# Patient Record
Sex: Female | Born: 1985 | Race: Black or African American | Hispanic: No | Marital: Single | Smoking: Former smoker
Health system: Southern US, Community
[De-identification: ages and names within clinical notes are randomized; demographics above are authoritative.]

## PROBLEM LIST (undated history)

## (undated) DIAGNOSIS — I10 Essential (primary) hypertension: Secondary | ICD-10-CM

## (undated) DIAGNOSIS — F419 Anxiety disorder, unspecified: Secondary | ICD-10-CM

## (undated) DIAGNOSIS — R519 Headache, unspecified: Secondary | ICD-10-CM

## (undated) DIAGNOSIS — R51 Headache: Secondary | ICD-10-CM

---

## 2010-04-29 ENCOUNTER — Emergency Department (HOSPITAL_COMMUNITY): Admission: EM | Admit: 2010-04-29 | Discharge: 2010-04-29 | Payer: Self-pay | Admitting: Emergency Medicine

## 2010-09-03 LAB — POCT RAPID STREP A (OFFICE): Streptococcus, Group A Screen (Direct): POSITIVE — AB

## 2012-04-06 ENCOUNTER — Encounter: Payer: 59 | Admitting: Obstetrics and Gynecology

## 2012-12-08 ENCOUNTER — Emergency Department (HOSPITAL_COMMUNITY)
Admission: EM | Admit: 2012-12-08 | Discharge: 2012-12-08 | Disposition: A | Discharge status: Home / Self Care | Payer: BC Managed Care – PPO | Attending: Emergency Medicine | Admitting: Emergency Medicine

## 2012-12-08 ENCOUNTER — Encounter (HOSPITAL_COMMUNITY): Payer: Self-pay | Admitting: Emergency Medicine

## 2012-12-08 DIAGNOSIS — M549 Dorsalgia, unspecified: Secondary | ICD-10-CM

## 2012-12-08 DIAGNOSIS — IMO0001 Reserved for inherently not codable concepts without codable children: Secondary | ICD-10-CM | POA: Insufficient documentation

## 2012-12-08 DIAGNOSIS — M545 Low back pain, unspecified: Secondary | ICD-10-CM | POA: Insufficient documentation

## 2012-12-08 DIAGNOSIS — Z87828 Personal history of other (healed) physical injury and trauma: Secondary | ICD-10-CM | POA: Insufficient documentation

## 2012-12-08 DIAGNOSIS — M546 Pain in thoracic spine: Secondary | ICD-10-CM | POA: Insufficient documentation

## 2012-12-08 DIAGNOSIS — R52 Pain, unspecified: Secondary | ICD-10-CM | POA: Insufficient documentation

## 2012-12-08 MED ORDER — DIAZEPAM 5 MG PO TABS
5.0000 mg | ORAL_TABLET | Freq: Once | ORAL | Status: AC
  Administered 2012-12-08: 5 mg via ORAL
  Filled 2012-12-08: qty 1

## 2012-12-08 MED ORDER — OXYCODONE-ACETAMINOPHEN 5-325 MG PO TABS
2.0000 | ORAL_TABLET | Freq: Once | ORAL | Status: DC
  Filled 2012-12-08: qty 2

## 2012-12-08 MED ORDER — DIAZEPAM 5 MG PO TABS: 5.0000 mg | ORAL_TABLET | Freq: Two times a day (BID) | ORAL | Status: DC | PRN

## 2012-12-08 MED ORDER — TRAMADOL HCL 50 MG PO TABS
50.0000 mg | ORAL_TABLET | Freq: Once | ORAL | Status: AC
  Administered 2012-12-08: 50 mg via ORAL
  Filled 2012-12-08: qty 1

## 2012-12-08 MED ORDER — TRAMADOL HCL 50 MG PO TABS: 50.0000 mg | ORAL_TABLET | Freq: Four times a day (QID) | ORAL | Status: DC | PRN

## 2012-12-08 NOTE — ED Notes (Signed)
Pt reports she has spasming from neck radiating down to lower back; pain she has felt before but this time it is worse it has ever been. Reports lying on stomach and icing it helped last time.

## 2012-12-08 NOTE — ED Notes (Signed)
PA at bedside.

## 2012-12-08 NOTE — ED Notes (Signed)
Ice pack provided. Pt is tearful; states she has never had pain this badly.

## 2012-12-08 NOTE — ED Notes (Signed)
PT ambulated with baseline gait; VSS; A&Ox3; no signs of distress; respirations even and unlabored; skin warm and dry; no questions upon discharge.  

## 2012-12-08 NOTE — ED Notes (Signed)
Patient is resting comfortably and reports feeling better.
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
## 2012-12-09 NOTE — ED Provider Notes (Signed)
Medical screening examination/treatment/procedure(s) were performed by non-physician practitioner and as supervising physician I was immediately available for consultation/collaboration.   Terin Dierolf III, MD 12/09/12 1005 

## 2012-12-10 ENCOUNTER — Ambulatory Visit (INDEPENDENT_AMBULATORY_CARE_PROVIDER_SITE_OTHER): Payer: 59 | Admitting: Family Medicine

## 2012-12-10 ENCOUNTER — Encounter: Payer: Self-pay | Admitting: Family Medicine

## 2012-12-10 ENCOUNTER — Ambulatory Visit: Payer: 59 | Admitting: Family Medicine

## 2012-12-10 VITALS — BP 120/84 | Temp 98.6°F | Ht 64.5 in | Wt 194.0 lb

## 2012-12-10 DIAGNOSIS — K047 Periapical abscess without sinus: Secondary | ICD-10-CM

## 2012-12-10 MED ORDER — AMOXICILLIN 875 MG PO TABS: 875.0000 mg | ORAL_TABLET | Freq: Two times a day (BID) | ORAL | Status: DC

## 2012-12-10 NOTE — Progress Notes (Signed)
  Subjective:    Patient ID: Breanna Pope, female    DOB: 02/27/1986, 27 y.o.   MRN: [id number]  HPI She has a two-day history of right upper tooth pain and swelling.   Review of Systems     Objective:   Physical Exam Alert and in no distress. Right cheek is slightly swollen. She does have some tenderness palpation over the first molar on the right upper teeth. Exam of the area does show some slight erythema with pain on motion of the tooth.. No adenopathy noted.       Assessment & Plan:  Tooth abscess - Plan: amoxicillin (AMOXIL) 875 MG tablet recommend ibuprofen for pain relief and used tramadol which she or he has. Also recommend she followup with a dentist.

## 2012-12-10 NOTE — Patient Instructions (Signed)
Take all the antibiotic and use 4 Advil 3 times per day and if you need extra pain relief use her tramadol. Him to see the dentist

## 2013-05-16 ENCOUNTER — Encounter (HOSPITAL_COMMUNITY): Payer: Self-pay | Admitting: Family Medicine

## 2013-05-16 ENCOUNTER — Emergency Department (INDEPENDENT_AMBULATORY_CARE_PROVIDER_SITE_OTHER)
Admission: EM | Admit: 2013-05-16 | Discharge: 2013-05-16 | Disposition: A | Discharge status: Home / Self Care | Payer: 59 | Source: Home / Self Care

## 2013-05-16 DIAGNOSIS — K047 Periapical abscess without sinus: Secondary | ICD-10-CM | POA: Diagnosis present

## 2013-05-16 DIAGNOSIS — J029 Acute pharyngitis, unspecified: Secondary | ICD-10-CM

## 2013-05-16 LAB — POCT INFECTIOUS MONO SCREEN: Mono Screen: NEGATIVE

## 2013-05-16 LAB — POCT RAPID STREP A: Streptococcus, Group A Screen (Direct): NEGATIVE

## 2013-05-16 MED ORDER — AMOXICILLIN 875 MG PO TABS: 875.0000 mg | ORAL_TABLET | Freq: Two times a day (BID) | ORAL | Status: DC

## 2013-05-16 NOTE — ED Provider Notes (Signed)
Medical screening examination/treatment/procedure(s) were performed by a resident physician or non-physician practitioner and as the supervising physician I was immediately available for consultation/collaboration.  Evan Corey, MD      Evan S Corey, MD 05/16/13 2112 

## 2013-05-16 NOTE — ED Provider Notes (Signed)
CSN: [id number]     Arrival date & time 05/16/13  1558 History   None    Chief Complaint  Patient presents with  . Sore Throat  . Headache   (Consider location/radiation/quality/duration/timing/severity/associated sxs/prior Treatment) HPI  Sore throat: started last night. Associated w/ cough, HA, and body aches, adn fatigue. Denies fevers. Decreased PO. Denies h/o mono. Getting worse. Now feeling nauseaus. Nephew w/ cough (lives w/ pt.)  History reviewed. No pertinent past medical history. History reviewed. No pertinent past surgical history. No family history on file. History  Substance Use Topics  . Smoking status: Former Games developer  . Smokeless tobacco: Never Used  . Alcohol Use: Not on file   OB History   Grav Para Term Preterm Abortions TAB SAB Ect Mult Living                 Review of Systems  Constitutional: Positive for chills, activity change, appetite change and fatigue. Negative for fever.  Respiratory: Negative for shortness of breath.   Cardiovascular: Negative for chest pain.  All other systems reviewed and are negative.    Allergies  Review of patient's allergies indicates no known allergies.  Home Medications   Current Outpatient Rx  Name  Route  Sig  Dispense  Refill  . Pseudoeph-Doxylamine-DM-APAP (NYQUIL PO)   Oral   Take by mouth.         Marland Kitchen acetaminophen (TYLENOL) 500 MG tablet   Oral   Take 1,500 mg by mouth every 6 (six) hours as needed for pain.         Marland Kitchen amoxicillin (AMOXIL) 875 MG tablet   Oral   Take 1 tablet (875 mg total) by mouth 2 (two) times daily.   20 tablet   0   . diazepam (VALIUM) 5 MG tablet   Oral   Take 1 tablet (5 mg total) by mouth every 12 (twelve) hours as needed for anxiety.   10 tablet   0   . Multiple Vitamin (MULTIVITAMIN WITH MINERALS) TABS   Oral   Take 1 tablet by mouth daily.         . traMADol (ULTRAM) 50 MG tablet   Oral   Take 1 tablet (50 mg total) by mouth every 6 (six) hours as needed for  pain.   15 tablet   0    BP 141/87  Pulse 86  Temp(Src) 99.1 F (37.3 C) (Oral)  Resp 16  SpO2 97%  LMP 04/30/2013 Physical Exam  Constitutional: She is oriented to person, place, and time. She appears well-developed and well-nourished. No distress.  HENT:  EOMI, Mild maxillary sinus tenderness on palpation, tonsils nml, no exudate and mild pharyngeal injection. No cervical lymphadenopathy. Mild boggy nasal turbinates.   Cardiovascular: Normal rate, normal heart sounds and intact distal pulses.   Pulmonary/Chest: Breath sounds normal. She is in respiratory distress.  Abdominal: Soft.  Musculoskeletal: Normal range of motion.  Neurological: She is alert and oriented to person, place, and time.  Skin: Skin is warm and dry.  Psychiatric: She has a normal mood and affect. Her behavior is normal. Judgment and thought content normal.    ED Course  Procedures (including critical care time) Labs Review Labs Reviewed  MONONUCLEOSIS SCREEN  POCT INFECTIOUS MONO SCREEN  POCT RAPID STREP A ([facility] ONLY)   Imaging Review No results found.  EKG Interpretation    Date/Time:    Ventricular Rate:    PR Interval:    QRS Duration:  QT Interval:    QTC Calculation:   R Axis:     Text Interpretation:              MDM   1. Sore throat   Sore throat. Strep likely etiology despite neg rapid strep due to acute onset, PE,  adn severity of symptoms w/ lack of viral URI symptomatology. Mono Neg. - Amox if symptoms worsen - Ibuprofen 600mg  - precuations given and all questions answered  Shelly Flatten, MD Family Medicine PGY-3 05/16/2013, 5:44 PM      Ozella Rocks, MD 05/16/13 1744  Ozella Rocks, MD 05/16/13 [phone]

## 2013-05-16 NOTE — ED Notes (Signed)
C/o sore throat, body aches, fatigue, body pains, headache and laryngitis.  Onset Saturday night of symptoms.

## 2013-05-18 LAB — CULTURE, GROUP A STREP

## 2013-05-24 ENCOUNTER — Telehealth: Payer: Self-pay | Admitting: Family Medicine

## 2013-05-24 NOTE — Telephone Encounter (Signed)
ER LETTER SENT 

## 2013-06-29 ENCOUNTER — Encounter (HOSPITAL_COMMUNITY): Payer: Self-pay | Admitting: Emergency Medicine

## 2013-06-29 ENCOUNTER — Emergency Department (INDEPENDENT_AMBULATORY_CARE_PROVIDER_SITE_OTHER)
Admission: EM | Admit: 2013-06-29 | Discharge: 2013-06-29 | Disposition: A | Discharge status: Home / Self Care | Payer: 59 | Source: Home / Self Care | Attending: Emergency Medicine | Admitting: Emergency Medicine

## 2013-06-29 DIAGNOSIS — A084 Viral intestinal infection, unspecified: Secondary | ICD-10-CM

## 2013-06-29 DIAGNOSIS — A088 Other specified intestinal infections: Secondary | ICD-10-CM

## 2013-06-29 LAB — POCT I-STAT, CHEM 8
BUN: 8 mg/dL (ref 6–23)
Calcium, Ion: 1.29 mmol/L — ABNORMAL HIGH (ref 1.12–1.23)
Chloride: 107 mEq/L (ref 96–112)
Creatinine, Ser: 0.9 mg/dL (ref 0.50–1.10)
GLUCOSE: 93 mg/dL (ref 70–99)
HEMATOCRIT: 39 % (ref 36.0–46.0)
HEMOGLOBIN: 13.3 g/dL (ref 12.0–15.0)
POTASSIUM: 3.9 meq/L (ref 3.7–5.3)
SODIUM: 143 meq/L (ref 137–147)
TCO2: 25 mmol/L (ref 0–100)

## 2013-06-29 MED ORDER — ONDANSETRON 4 MG PO TBDP
8.0000 mg | ORAL_TABLET | Freq: Once | ORAL | Status: AC
  Administered 2013-06-29: 8 mg via ORAL

## 2013-06-29 MED ORDER — ONDANSETRON 8 MG PO TBDP: 8.0000 mg | ORAL_TABLET | Freq: Three times a day (TID) | ORAL | Status: DC | PRN

## 2013-06-29 MED ORDER — ONDANSETRON 4 MG PO TBDP
ORAL_TABLET | ORAL | Status: AC
  Filled 2013-06-29: qty 2

## 2013-06-29 MED ORDER — DIPHENOXYLATE-ATROPINE 2.5-0.025 MG PO TABS: 1.0000 | ORAL_TABLET | Freq: Four times a day (QID) | ORAL | Status: DC | PRN

## 2013-06-29 MED ORDER — SODIUM CHLORIDE 0.9 % IV SOLN
INTRAVENOUS | Status: DC
  Administered 2013-06-29: 13:00:00 via INTRAVENOUS

## 2013-06-29 NOTE — ED Provider Notes (Signed)
Chief Complaint   Chief Complaint  Patient presents with  . URI     History of Present Illness   Breanna SellsConstance Pope is a 28 year old female who has had a history since last night of nausea and vomiting with greenish emesis. She vomited 3 times. She had 2 diarrheal stools. Today she feels fatigued and faint. She's also had some rhinorrhea and cough. She denies any fever or chills. No blood in the vomitus or the stools. She denies any recent travel, suspicious exposures, ingestions, or animal exposure.  Review of Systems   Other than as noted above, the patient denies any of the following symptoms: Systemic:  No fevers, chills, sweats, weight loss or gain, fatigue, or tiredness. ENT:  No nasal congestion, rhinorrhea, or sore throat. Lungs:  No cough, wheezing, or shortness of breath. Cardiac:  No chest pain, syncope, or presyncope. GI:  No abdominal pain, nausea, vomiting, anorexia, diarrhea, constipation, blood in stool or vomitus. GU:  No dysuria, frequency, or urgency.  PMFSH   Past medical history, family history, social history, meds, and allergies were reviewed.   Physical Exam     Vital signs:  BP 125/78  Pulse 88  Temp(Src) 97.1 F (36.2 C) (Oral)  Resp 18  Ht 5' 3.5" (1.613 m)  Wt 195 lb (88.451 kg)  BMI 34.00 kg/m2  SpO2 100%  LMP 06/17/2013 Filed Vitals:   06/29/13 1201 06/29/13 1237 Supine  06/29/13 1238 Sitting  06/29/13 1239 Standing   BP: 124/54 123/67 128/88 125/78  Pulse: 88 72  88  Temp: 97.1 F (36.2 C)     TempSrc: Oral     Resp: 18     Height: 5' 3.5" (1.613 m)     Weight: 195 lb (88.451 kg)     SpO2: 100%      General:  Alert and oriented.  In no distress.  Skin warm and dry.  Good skin turgor, brisk capillary refill. ENT:  No scleral icterus, moist mucous membranes, no oral lesions, pharynx clear. Lungs:  Breath sounds clear and equal bilaterally.  No wheezes, rales, or rhonchi. Heart:  Rhythm regular, without extrasystoles.  No gallops or  murmers. Abdomen:  Soft, flat, nondistended. No organomegaly or mass. Bowel sounds are hyperactive. No tenderness, guarding, or rebound. Skin: Clear, warm, and dry.  Good turgor.  Brisk capillary refill.  Labs   Results for orders placed during the hospital encounter of 06/29/13  POCT I-STAT, CHEM 8      Result Value Range   Sodium 143  137 - 147 mEq/L   Potassium 3.9  3.7 - 5.3 mEq/L   Chloride 107  96 - 112 mEq/L   BUN 8  6 - 23 mg/dL   Creatinine, Ser 9.140.90  0.50 - 1.10 mg/dL   Glucose, Bld 93  70 - 99 mg/dL   Calcium, Ion 7.821.29 (*) 1.12 - 1.23 mmol/L   TCO2 25  0 - 100 mmol/L   Hemoglobin 13.3  12.0 - 15.0 g/dL   HCT 95.639.0  21.336.0 - 08.646.0 %     Course in Urgent Care Center   She was given Zofran ODT 8 mg sublingually and 1 L of normal saline IV. Thereafter the patient states she felt a lot better.   Assessment   The encounter diagnosis was Viral gastroenteritis.  Plan   1.  Meds:  The following meds were prescribed:   Discharge Medication List as of 06/29/2013  2:31 PM    START taking these medications  Details  diphenoxylate-atropine (LOMOTIL) 2.5-0.025 MG per tablet Take 1 tablet by mouth 4 (four) times daily as needed for diarrhea or loose stools., Starting 06/29/2013, Until Discontinued, Print    ondansetron (ZOFRAN ODT) 8 MG disintegrating tablet Take 1 tablet (8 mg total) by mouth every 8 (eight) hours as needed for nausea., Starting 06/29/2013, Until Discontinued, Normal        2.  Patient Education/Counseling:  The patient was given appropriate handouts, self care instructions, and instructed in symptomatic relief. The patient was told to stay on clear liquids for the remainder of the day, then advance to a B.R.A.T. diet starting tomorrow.  3.  Follow up:  The patient was told to follow up here if no better in 2 to 3 days, or sooner if becoming worse in any way, and given some red flag symptoms such as persistent vomitng, high fever, severe abdominal pain, or any GI  bleeding which would prompt immediate return.        Reuben Likes, MD 06/29/13 2231

## 2013-06-29 NOTE — ED Notes (Signed)
Pt reports she feels much better upon d/c

## 2013-06-29 NOTE — ED Notes (Signed)
Pt c/o cold sxs onset last night w/sxs that include: vomiting, diarrhea, light headed, cough, near syncope, runny nose Reports she has had 2 loose stools today and 4 episodes of vomiting Denies: fevers, SOB, wheezing... States her nephew and sister are sick Alert w/no signs of acute distress.

## 2013-06-29 NOTE — Discharge Instructions (Signed)

## 2014-02-13 ENCOUNTER — Encounter (HOSPITAL_COMMUNITY): Payer: Self-pay | Admitting: Emergency Medicine

## 2014-02-13 ENCOUNTER — Emergency Department (HOSPITAL_COMMUNITY)
Admission: EM | Admit: 2014-02-13 | Discharge: 2014-02-14 | Discharge status: Against Medical Advice | Payer: 59 | Attending: Emergency Medicine | Admitting: Emergency Medicine

## 2014-02-13 DIAGNOSIS — K089 Disorder of teeth and supporting structures, unspecified: Secondary | ICD-10-CM | POA: Insufficient documentation

## 2014-02-13 NOTE — ED Notes (Signed)
Pt states she has an abscessed tooth on the top left and is here to get some antibiotics

## 2014-05-22 ENCOUNTER — Encounter (HOSPITAL_COMMUNITY): Payer: Self-pay

## 2014-05-22 ENCOUNTER — Emergency Department (INDEPENDENT_AMBULATORY_CARE_PROVIDER_SITE_OTHER)
Admission: EM | Admit: 2014-05-22 | Discharge: 2014-05-22 | Disposition: A | Discharge status: Home / Self Care | Payer: 59 | Source: Home / Self Care | Attending: Emergency Medicine | Admitting: Emergency Medicine

## 2014-05-22 DIAGNOSIS — J00 Acute nasopharyngitis [common cold]: Secondary | ICD-10-CM

## 2014-05-22 NOTE — ED Notes (Signed)
Body aches, fever, cough, chills, HA, throat itchy. Brother here and is having similar symptoms

## 2014-05-22 NOTE — ED Provider Notes (Signed)
CSN: [id number]     Arrival date & time 05/22/14  1017 History   First MD Initiated Contact with Patient 05/22/14 1040     Chief Complaint  Patient presents with  . URI   (Consider location/radiation/quality/duration/timing/severity/associated sxs/prior Treatment) Patient is a 28 y.o. female presenting with URI. The history is provided by the patient.  URI Presenting symptoms: congestion, cough, fatigue, rhinorrhea and sore throat   Severity:  Mild Onset quality:  Gradual Duration:  24 hours Timing:  Constant Progression:  Unchanged Chronicity:  New Associated symptoms: headaches and myalgias   Associated symptoms: no neck pain and no wheezing     History reviewed. No pertinent past medical history. History reviewed. No pertinent past surgical history. Family History  Problem Relation Age of Onset  . Hypertension Mother   . Hypertension Other   . Diabetes Other    History  Substance Use Topics  . Smoking status: Former Smoker    Types: Cigarettes    Quit date: 11/21/2013  . Smokeless tobacco: Never Used  . Alcohol Use: No   OB History    No data available     Review of Systems  Constitutional: Positive for fatigue.  HENT: Positive for congestion, rhinorrhea and sore throat.   Respiratory: Positive for cough. Negative for wheezing.   Musculoskeletal: Positive for myalgias. Negative for neck pain.  Neurological: Positive for headaches.  All other systems reviewed and are negative.   Allergies  Review of patient's allergies indicates no known allergies.  Home Medications   Prior to Admission medications   Medication Sig Start Date End Date Taking? Authorizing Provider  ibuprofen (ADVIL,MOTRIN) 200 MG tablet Take 400 mg by mouth every 6 (six) hours as needed (for pain.).    Historical Provider, MD   BP 121/72 mmHg  Pulse 68  Temp(Src) 98.1 F (36.7 C) (Oral)  Resp 16  SpO2 98% Physical Exam  Constitutional: She is oriented to person, place, and time. She  appears well-developed and well-nourished. No distress.  HENT:  Head: Normocephalic and atraumatic.  Right Ear: Hearing, tympanic membrane, external ear and ear canal normal.  Left Ear: Hearing, tympanic membrane, external ear and ear canal normal.  Nose: Nose normal.  Mouth/Throat: Uvula is midline, oropharynx is clear and moist and mucous membranes are normal.  Eyes: Conjunctivae are normal. No scleral icterus.  Neck: Normal range of motion. Neck supple.  Cardiovascular: Normal rate, regular rhythm and normal heart sounds.   Pulmonary/Chest: Effort normal and breath sounds normal.  Musculoskeletal: Normal range of motion.  Lymphadenopathy:    She has no cervical adenopathy.  Neurological: She is alert and oriented to person, place, and time.  Skin: Skin is warm and dry. No rash noted. No erythema.  Psychiatric: She has a normal mood and affect. Her behavior is normal.  Nursing note and vitals reviewed.   ED Course  Procedures (including critical care time) Labs Review Labs Reviewed - No data to display  Imaging Review No results found.   MDM   1. Common cold    Exam benign Symptomatic care at home    Ria ClockJennifer Lee H Panhia Karl, [place]PA 05/22/14 1129

## 2014-05-22 NOTE — Discharge Instructions (Signed)
Fluids, rest, tylenol as directed on packaging for aches and fever Delsym as directed on packaging for cough Salt water gargles for throat Upper Respiratory Infection, Adult An upper respiratory infection (URI) is also sometimes known as the common cold. The upper respiratory tract includes the nose, sinuses, throat, trachea, and bronchi. Bronchi are the airways leading to the lungs. Most people improve within 1 week, but symptoms can last up to 2 weeks. A residual cough may last even longer.  CAUSES Many different viruses can infect the tissues lining the upper respiratory tract. The tissues become irritated and inflamed and often become very moist. Mucus production is also common. A cold is contagious. You can easily spread the virus to others by oral contact. This includes kissing, sharing a glass, coughing, or sneezing. Touching your mouth or nose and then touching a surface, which is then touched by another person, can also spread the virus. SYMPTOMS  Symptoms typically develop 1 to 3 days after you come in contact with a cold virus. Symptoms vary from person to person. They may include:  Runny nose.  Sneezing.  Nasal congestion.  Sinus irritation.  Sore throat.  Loss of voice (laryngitis).  Cough.  Fatigue.  Muscle aches.  Loss of appetite.  Headache.  Low-grade fever. DIAGNOSIS  You might diagnose your own cold based on familiar symptoms, since most people get a cold 2 to 3 times a year. Your caregiver can confirm this based on your exam. Most importantly, your caregiver can check that your symptoms are not due to another disease such as strep throat, sinusitis, pneumonia, asthma, or epiglottitis. Blood tests, throat tests, and X-rays are not necessary to diagnose a common cold, but they may sometimes be helpful in excluding other more serious diseases. Your caregiver will decide if any further tests are required. RISKS AND COMPLICATIONS  You may be at risk for a more  severe case of the common cold if you smoke cigarettes, have chronic heart disease (such as heart failure) or lung disease (such as asthma), or if you have a weakened immune system. The very young and very old are also at risk for more serious infections. Bacterial sinusitis, middle ear infections, and bacterial pneumonia can complicate the common cold. The common cold can worsen asthma and chronic obstructive pulmonary disease (COPD). Sometimes, these complications can require emergency medical care and may be life-threatening. PREVENTION  The best way to protect against getting a cold is to practice good hygiene. Avoid oral or hand contact with people with cold symptoms. Wash your hands often if contact occurs. There is no clear evidence that vitamin C, vitamin E, echinacea, or exercise reduces the chance of developing a cold. However, it is always recommended to get plenty of rest and practice good nutrition. TREATMENT  Treatment is directed at relieving symptoms. There is no cure. Antibiotics are not effective, because the infection is caused by a virus, not by bacteria. Treatment may include:  Increased fluid intake. Sports drinks offer valuable electrolytes, sugars, and fluids.  Breathing heated mist or steam (vaporizer or shower).  Eating chicken soup or other clear broths, and maintaining good nutrition.  Getting plenty of rest.  Using gargles or lozenges for comfort.  Controlling fevers with ibuprofen or acetaminophen as directed by your caregiver.  Increasing usage of your inhaler if you have asthma. Zinc gel and zinc lozenges, taken in the first 24 hours of the common cold, can shorten the duration and lessen the severity of symptoms. Pain medicines may  help with fever, muscle aches, and throat pain. A variety of non-prescription medicines are available to treat congestion and runny nose. Your caregiver can make recommendations and may suggest nasal or lung inhalers for other symptoms.    HOME CARE INSTRUCTIONS   Only take over-the-counter or prescription medicines for pain, discomfort, or fever as directed by your caregiver.  Use a warm mist humidifier or inhale steam from a shower to increase air moisture. This may keep secretions moist and make it easier to breathe.  Drink enough water and fluids to keep your urine clear or pale yellow.  Rest as needed.  Return to work when your temperature has returned to normal or as your caregiver advises. You may need to stay home longer to avoid infecting others. You can also use a face mask and careful hand washing to prevent spread of the virus. SEEK MEDICAL CARE IF:   After the first few days, you feel you are getting worse rather than better.  You need your caregiver's advice about medicines to control symptoms.  You develop chills, worsening shortness of breath, or brown or red sputum. These may be signs of pneumonia.  You develop yellow or brown nasal discharge or pain in the face, especially when you bend forward. These may be signs of sinusitis.  You develop a fever, swollen neck glands, pain with swallowing, or white areas in the back of your throat. These may be signs of strep throat. SEEK IMMEDIATE MEDICAL CARE IF:   You have a fever.  You develop severe or persistent headache, ear pain, sinus pain, or chest pain.  You develop wheezing, a prolonged cough, cough up blood, or have a change in your usual mucus (if you have chronic lung disease).  You develop sore muscles or a stiff neck. Document Released: 12/03/2000 Document Revised: 09/01/2011 Document Reviewed: 09/14/2013 [facility] Patient Information 2015 [place], [place]. This information is not intended to replace advice given to you by your health care provider. Make sure you discuss any questions you have with your health care provider.

## 2014-06-04 ENCOUNTER — Encounter (HOSPITAL_COMMUNITY): Payer: Self-pay

## 2014-06-04 ENCOUNTER — Emergency Department (HOSPITAL_COMMUNITY)
Admission: EM | Admit: 2014-06-04 | Discharge: 2014-06-04 | Disposition: A | Discharge status: Home / Self Care | Payer: 59 | Attending: Emergency Medicine | Admitting: Emergency Medicine

## 2014-06-04 DIAGNOSIS — Z720 Tobacco use: Secondary | ICD-10-CM | POA: Insufficient documentation

## 2014-06-04 DIAGNOSIS — K047 Periapical abscess without sinus: Secondary | ICD-10-CM | POA: Diagnosis not present

## 2014-06-04 DIAGNOSIS — K088 Other specified disorders of teeth and supporting structures: Secondary | ICD-10-CM | POA: Diagnosis present

## 2014-06-04 DIAGNOSIS — K029 Dental caries, unspecified: Secondary | ICD-10-CM | POA: Diagnosis not present

## 2014-06-04 MED ORDER — NAPROXEN 500 MG PO TABS
500.0000 mg | ORAL_TABLET | Freq: Once | ORAL | Status: AC
  Administered 2014-06-04: 500 mg via ORAL
  Filled 2014-06-04: qty 1

## 2014-06-04 MED ORDER — HYDROCODONE-ACETAMINOPHEN 5-325 MG PO TABS: 1.0000 | ORAL_TABLET | Freq: Four times a day (QID) | ORAL | Status: DC | PRN

## 2014-06-04 MED ORDER — PENICILLIN V POTASSIUM 500 MG PO TABS: 500.0000 mg | ORAL_TABLET | Freq: Four times a day (QID) | ORAL | Status: AC

## 2014-06-04 MED ORDER — PENICILLIN V POTASSIUM 500 MG PO TABS
500.0000 mg | ORAL_TABLET | Freq: Once | ORAL | Status: AC
  Administered 2014-06-04: 500 mg via ORAL
  Filled 2014-06-04: qty 1

## 2014-06-04 MED ORDER — NAPROXEN 500 MG PO TABS: 500.0000 mg | ORAL_TABLET | Freq: Two times a day (BID) | ORAL | Status: DC

## 2014-06-04 NOTE — Discharge Instructions (Signed)
Take penicillin as prescribed. Take naproxen as prescribed for pain control. You may take Norco in addition to naproxen for severe pain. Do not drive or drink alcohol after taking this medication. Recommend follow-up with a dentist for further evaluation of symptoms. If your abscess persists, it will likely need draining by a dentist. Follow-up with the emergency department if symptoms worsen.  Dental Abscess A dental abscess is a collection of infected fluid (pus) from a bacterial infection in the inner part of the tooth (pulp). It usually occurs at the end of the tooth's root.  CAUSES   Severe tooth decay.  Trauma to the tooth that allows bacteria to enter into the pulp, such as a broken or chipped tooth. SYMPTOMS   Severe pain in and around the infected tooth.  Swelling and redness around the abscessed tooth or in the mouth or face.  Tenderness.  Pus drainage.  Bad breath.  Bitter taste in the mouth.  Difficulty swallowing.  Difficulty opening the mouth.  Nausea.  Vomiting.  Chills.  Swollen neck glands. DIAGNOSIS   A medical and dental history will be taken.  An examination will be performed by tapping on the abscessed tooth.  X-rays may be taken of the tooth to identify the abscess. TREATMENT The goal of treatment is to eliminate the infection. You may be prescribed antibiotic medicine to stop the infection from spreading. A root canal may be performed to save the tooth. If the tooth cannot be saved, it may be pulled (extracted) and the abscess may be drained.  HOME CARE INSTRUCTIONS  Only take over-the-counter or prescription medicines for pain, fever, or discomfort as directed by your caregiver.  Rinse your mouth (gargle) often with salt water ( tsp salt in 8 oz [250 ml] of warm water) to relieve pain or swelling.  Do not drive after taking pain medicine (narcotics).  Do not apply heat to the outside of your face.  Return to your dentist for further  treatment as directed. SEEK MEDICAL CARE IF:  Your pain is not helped by medicine.  Your pain is getting worse instead of better. SEEK IMMEDIATE MEDICAL CARE IF:  You have a fever or persistent symptoms for more than 2-3 days.  You have a fever and your symptoms suddenly get worse.  You have chills or a very bad headache.  You have problems breathing or swallowing.  You have trouble opening your mouth.  You have swelling in the neck or around the eye. Document Released: 06/09/2005 Document Revised: 03/03/2012 Document Reviewed: 09/17/2010 [facility] Patient Information 2015 [place], [place]. This information is not intended to replace advice given to you by your health care provider. Make sure you discuss any questions you have with your health care provider.  Emergency Department Resource Guide 1) Find a Doctor and Pay Out of Pocket Although you won't have to find out who is covered by your insurance plan, it is a good idea to ask around and get recommendations. You will then need to call the office and see if the doctor you have chosen will accept you as a new patient and what types of options they offer for patients who are self-pay. Some doctors offer discounts or will set up payment plans for their patients who do not have insurance, but you will need to ask so you aren't surprised when you get to your appointment.  2) Contact Your Local Health Department Not all health departments have doctors that can see patients for sick visits, but many  do, so it is worth a call to see if yours does. If you don't know where your local health department is, you can check in your phone book. The CDC also has a tool to help you locate your state's health department, and many state websites also have listings of all of their local health departments.  3) Find a Walk-in Clinic If your illness is not likely to be very severe or complicated, you may want to try a walk in clinic. These are popping up all  over the country in pharmacies, drugstores, and shopping centers. They're usually staffed by nurse practitioners or physician assistants that have been trained to treat common illnesses and complaints. They're usually fairly quick and inexpensive. However, if you have serious medical issues or chronic medical problems, these are probably not your best option.  No Primary Care Doctor: - Call Health Connect at  [phone] - they can help you locate a primary care doctor that  accepts your insurance, provides certain services, etc. - Physician Referral Service- [phone]  Chronic Pain Problems: Organization         Address  Phone   Notes  [facility]  [phone] Patients need to be referred by their primary care doctor.   Medication Assistance: Organization         Address  Phone   Notes  [facility] Medication [facility] [address] [place] [place], [place] [address] [phone] --Must be a resident of [facility] -- Must have NO insurance coverage whatsoever (no Medicaid/ Medicare, etc.) -- The pt. MUST have a primary care doctor that directs their care regularly and follows them in the community   [facility]ist  [phone]   [employer]  [phone]    Agencies that provide inexpensive medical care: Organization         Address  Phone   Notes  [facility]  [phone]   [facility]    [phone]   [facility] [address] [place], [place] [address] [phone]   [facility] [address] [place] [place] [phone]   Planned Parenthood    [phone]   [facility]    [phone]   [facility]  [address], [place] Phone:  [phone], Fax:  [phone] Hours of Operation:  9 am - 6 pm, M-F.  Also accepts Medicaid/Medicare and self-pay.  [facility]  [address], [place] Phone: [phone], Fax: [phone]. Hours of Operation:  8:30 am - 5:30 pm, M-F.  Also accepts Medicaid and self-pay.  [facility] [place] [address], [place] Phone: [phone]   [facility] [address] Natasha Bence [place], [place] [phone], Ext. [phone] Mondays & Thursdays: 7-9 AM.  First 15 patients are seen on a first come, first serve basis.    Medicaid-accepting [facility] Providers:  Organization         Address  Phone   Notes  [facility] [address], [place] [phone] Also accepts self-pay patients.  [facility] [address] Laurell Josephs [place], [place]  [phone]   [facility] [address], [place] [phone]   [facility] [address], [place] [phone]   Renaye Rakers  [address], [place]   [phone] Only accepts [place] patients after they have their name applied to their card.   Self-Pay (no insurance) in [facility]:  Organization         Address  Phone   Notes  Sickle Cell Patients, [facility] [address] [place], [place] [phone]   [facility] [address] [place], [place] [phone]   [facility]  [address], [place] [phone]   [facility]/Dr. Osei-Bonsu  [address], [place] or [address], [place] [phone] Phone number for both [place] and [place] locations is the same.  [facility] [address], [place] [phone]   [facility] [address] [place] or [address] [phone]   [facility] [address] [place] [phone], phone; [phone], fax Sees patients 1st and 3rd Saturday of every  month.  Must not qualify for public or private insurance (i.e. Medicaid, Medicare, [place] Health Choice, Veterans' Benefits)  Household income should be no more than 200% of the poverty level The clinic cannot treat you if you are pregnant or think you are pregnant  Sexually transmitted diseases are not treated at the clinic.    Dental Care: Organization         Address  Phone  Notes  [facility] Department of [facility] [address] [place], [place] [phone] Accepts children up to age 69 who are enrolled in [place] or [place] Health Choice; pregnant women with a Medicaid card; and children who have applied for Medicaid or [place] Health Choice, but were declined, whose parents can pay a reduced fee at time of service.  [facility] Department of [facility]  [address], [place] [phone] Accepts children up to age 26 who are enrolled in [place] or [place] Health Choice; pregnant women with a Medicaid card; and children who have applied for Medicaid or  Health Choice, but were declined, whose parents can pay a reduced fee at time of service.  [facility]  [address] [place], [place] [phone] Patients are seen by appointment only. Walk-ins are not accepted. [facility] will see patients 90 years of age and older. Monday - Tuesday (8am-5pm) Most Wednesdays (8:30-5pm) $30 per visit, cash only  [facility]  [address], [facility] [phone] Patients are seen by appointment only. Walk-ins are not accepted. [facility] will see patients 57 years of age and older. One Wednesday Evening (Monthly: Volunteer Based).  $30 per visit, cash only  [employer]  [phone] for adults; Children under age 54, call Graduate Pediatric Dentistry at [phone]. Children aged 22-14, please call [phone] to request a pediatric application.  Dental  services are provided in all areas of dental care including fillings, crowns and bridges, complete and partial dentures, implants, gum treatment, root canals, and extractions. Preventive care is also provided. Treatment is provided to both adults and children. Patients are selected via a lottery and there is often a waiting list.   [facility] [address], [place]  [phone] [email or web address]   [facility] [address] [place], [place] [phone], Ext. [phone] Second  and Fourth Thursday of each month, opens at 6:30 AM; Clinic ends at 9 AM.  Patients are seen on a first-come first-served basis, and a limited number are seen during each clinic.   [facility]  [address], [place], [place] [phone]) [phone]   Eligibility Requirements You must have lived in [place], [place], or [place] counties for at least the last three months.   You cannot be eligible for state or federal sponsored [place]healthcare insurance, including CIG[employer]eterans Administration, [place]icaid, or [employer]icare.   You generally cannot be eligible for healthcare insurance through your employer.    How to apply: Eligibility screenings are held every Tuesday and Wednesday afternoon from 1:00 pm until 4:00 pm. You do not need an appointment for the interview!  [facility] [address], [place], [place] [phone]   [facility]  [phone]   [facility]  [phone]   [facility]  [phone]    Behavioral Health Resources in the Community: Intensive Outpatient Programs Organization         Address  Phone  Notes  [facility] [address], [place], [place] [phone]   [facility] [address], [place], [place] [phone]   [facility] [address], [place], [place]  [phone]   [facility] [address],    [place], [place] [phone] or [phone]   Substance Abuse Resources Organization         Address  Phone  Notes  [facility]  [phone]   [facility]  [phone]   [facility]  [phone]   [facility]  [phone]   Residential & Outpatient Substance Abuse Program  [phone]   Psychological Services Organization         Address  Phone  Notes  [facility]  [phone]- [phone]   [facility]  [phone]- [phone]   [facility] [address], [place] [phone] or [phone]    Mobile Crisis Teams Organization         Address  Phone  Notes  [facility], Mobile Crisis Care Unit  [phone]   [facility]  [address] [place], [place] [phone]   Doristine LocksSharon DeEsch [address] [place] [phone]    Self-Help/Support Groups Organization         Address  Phone             Notes  [facility] [place] - variety of support groups  336- [phone] Call for more information  Narcotics Anonymous (NA), [facility] [address], [place]  2 meetings at this location   Statisticianesidential Treatment Programs Organization         Address  Phone  Notes  [facility] [address],    [place]  [phone]   [facility]  [address], [place] [phone], [place], [place] [phone]   [facility] [address], [place] [phone] Admissions: 8am-3pm M-F  [facility] [address]    [place], [place] [phone]   [facility] [address], [place], [place] [phone]   [facility] [address]  [place], [place] [phone]   [facility] - Intensive Outpatient [address], [place], [place] [phone]   [facility] ([facility] [phone]  [address]  [place], [place]  [phone] or [phone]   [facility] ([facility]) [address] [place], [place] [phone] Accepts Medicaid  [facility] [address]  [place] [phone] Substance Abuse/Addiction Treatment   [facility] Resources Organization         Address  Phone  Notes  [facility]  [phone]) [phone]   Angie FavaJulie Brannon, PhD [address] [place], [place]   [phone] or [phone]   [facility]   [address] [place], [place] [phone]) [phone]   [facility] [address], [place], [place] [phone] Insurance/Medicaid/sponsorship through [facility]  [facility] Families [address]                                    [place], [place] [phone] Therapy/tele-psych/case  [facility] [address]   [place], [place] [phone]) [phone]    Dr. Lolly MustacheArfeen  [phone]   [facility]  [employer] [facility] 1) [address], [place] 2) [address], [place] 3)  [address], [place] [phone]  [phone]   [facility] Child Abuse Hotline [phone](336) [phone] or [phone]) [phone] (After Hours)

## 2014-06-04 NOTE — ED Notes (Signed)
Pt presents with c/o dental pain on the bottom left of her mouth that started yesterday. Pt reports that today she started noticing some swelling to that same area.

## 2014-06-04 NOTE — ED Provider Notes (Signed)
CSN: [id number]     Arrival date & time 06/04/14  2049 History   First MD Initiated Contact with Patient 06/04/14 2106     This chart was scribed for non-physician practitioner, Antony MaduraKelly Chon Buhl, PA-C working with Warnell Foresterrey Wofford, MD by Arlan OrganAshley Leger, ED Scribe. This patient was seen in room WTR5/WTR5 and the patient's care was started at 9:17 PM.   Chief Complaint  Patient presents with  . Dental Pain   The history is provided by the patient. No language interpreter was used.    HPI Comments: Breanna SellsConstance Pope is a 28 y.o. female who presents to the Emergency Department complaining of dental pain x 2 day. She also reports  Facial swelling onset this morning. Pain is exacerbated with chewing. No alleviating factors at this time. She has tried OTC Ibuprofen and Goody Powders with mild improvement. Last dose Ibuprofen this morning at approximately 10 AM. No inability swallow or open/close the mouth. No documented fevers at home, per patient. No recent drooling. She is not currently followed by a dentist but plans to establish this coming week. No known allergies to medications.  History reviewed. No pertinent past medical history. History reviewed. No pertinent past surgical history. Family History  Problem Relation Age of Onset  . Hypertension Mother   . Hypertension Other   . Diabetes Other    History  Substance Use Topics  . Smoking status: Current Every Day Smoker    Types: Cigarettes    Last Attempt to Quit: 11/21/2013  . Smokeless tobacco: Never Used  . Alcohol Use: No   OB History    No data available      Review of Systems  Constitutional: Negative for fever and chills.  HENT: Positive for dental problem and facial swelling. Negative for trouble swallowing.   All other systems reviewed and are negative.   Allergies  Review of patient's allergies indicates no known allergies.  Home Medications   Prior to Admission medications   Medication Sig Start Date End Date Taking?  Authorizing Provider  Aspirin-Acetaminophen (GOODY BODY PAIN) 500-325 MG PACK Take 1 Package by mouth every 8 (eight) hours as needed (pain).   Yes Historical Provider, MD  ibuprofen (ADVIL,MOTRIN) 200 MG tablet Take 400 mg by mouth every 6 (six) hours as needed (for pain.).   Yes Historical Provider, MD  HYDROcodone-acetaminophen (NORCO/VICODIN) 5-325 MG per tablet Take 1-2 tablets by mouth every 6 (six) hours as needed for moderate pain or severe pain. 06/04/14   Antony MaduraKelly Lalana Wachter, PA-C  naproxen (NAPROSYN) 500 MG tablet Take 1 tablet (500 mg total) by mouth 2 (two) times daily. 06/04/14   Antony MaduraKelly Colin Ellers, PA-C  penicillin v potassium (VEETID) 500 MG tablet Take 1 tablet (500 mg total) by mouth 4 (four) times daily. 06/04/14 06/11/14  Antony MaduraKelly Tomisha Reppucci, PA-C   Triage Vitals: BP 148/85 mmHg  Pulse 101  Temp(Src) 100.4 F (38 C) (Oral)  Resp 18  SpO2 100%  LMP 05/15/2014 (Approximate)   Physical Exam  Constitutional: She is oriented to person, place, and time. She appears well-developed and well-nourished. No distress.  Nontoxic/nonseptic appearing  HENT:  Head: Normocephalic and atraumatic.  Mouth/Throat: Uvula is midline, oropharynx is clear and moist and mucous membranes are normal. No oral lesions. No trismus in the jaw. Abnormal dentition. Dental caries present. No uvula swelling.    Mild L sided facial swelling with TTP to L lower 1st molar. Mild gingival erythema. No gingival fluctuance or discharge. No trismus. Uvula midline. Patient tolerating secretions without  difficulty  Eyes: Conjunctivae and EOM are normal. No scleral icterus.  Neck: Normal range of motion.  No nuchal rigidity or meningismus  Pulmonary/Chest: Effort normal. No respiratory distress.  Respirations even and unlabored  Abdominal: She exhibits no distension.  Musculoskeletal: Normal range of motion.  Neurological: She is alert and oriented to person, place, and time. She exhibits normal muscle tone. Coordination normal.  GCS  15. Speech is goal oriented. Patient moves extremities without ataxia  Skin: Skin is warm and dry. No rash noted. She is not diaphoretic. No erythema. No pallor.  Psychiatric: She has a normal mood and affect. Her behavior is normal.  Nursing note and vitals reviewed.   ED Course  Procedures (including critical care time)  DIAGNOSTIC STUDIES: Oxygen Saturation is 100% on RA, Normal by my interpretation.    COORDINATION OF CARE: 9:16 PM- Will give Naprosyn and Veetid. Discussed treatment plan with pt at bedside and pt agreed to plan.     Labs Review Labs Reviewed - No data to display  Imaging Review No results found.   EKG Interpretation None      MDM   Final diagnoses:  Dental abscess    Nontoxic/nonseptic appearing female presents to the ED for toothache. Physical exam findings suggestive of early abscess, not amenable to I&D. Exam unconcerning for Ludwig's angina or spread of infection. Will treat with penicillin and pain medicine. Urged patient to follow-up with dentist. Referral and resource guide provided. Patient agreeable to plan with no unaddressed concerns.  I personally performed the services described in this documentation, which was scribed in my presence. The recorded information has been reviewed and is accurate.    Antony MaduraKelly Irina Okelly, PA-C 06/04/14 2152  Warnell Foresterrey Wofford, MD 06/07/14 [phone]

## 2014-06-19 ENCOUNTER — Telehealth: Payer: Self-pay | Admitting: Family Medicine

## 2014-06-19 NOTE — Telephone Encounter (Signed)
ER letter sent 

## 2014-07-24 ENCOUNTER — Encounter (HOSPITAL_COMMUNITY): Payer: Self-pay

## 2014-07-24 ENCOUNTER — Emergency Department (INDEPENDENT_AMBULATORY_CARE_PROVIDER_SITE_OTHER)
Admission: EM | Admit: 2014-07-24 | Discharge: 2014-07-24 | Disposition: A | Discharge status: Home / Self Care | Payer: Self-pay | Source: Home / Self Care | Attending: Family Medicine | Admitting: Family Medicine

## 2014-07-24 DIAGNOSIS — K047 Periapical abscess without sinus: Secondary | ICD-10-CM

## 2014-07-24 MED ORDER — CLINDAMYCIN HCL 300 MG PO CAPS: 300.0000 mg | ORAL_CAPSULE | Freq: Three times a day (TID) | ORAL | Status: DC

## 2014-07-24 NOTE — Discharge Instructions (Signed)

## 2014-07-24 NOTE — ED Provider Notes (Signed)
CSN: [id number]     Arrival date & time 07/24/14  1414 History   First MD Initiated Contact with Patient 07/24/14 1604     Chief Complaint  Patient presents with  . Oral Swelling   (Consider location/radiation/quality/duration/timing/severity/associated sxs/prior Treatment) HPI        29 year old female with history of recurrent dental abscesses presents complaining of a dental abscess that ruptured washing was in the lobby today. The pain is significantly improved since then. She has felt tasting drainage into her mouth. This is in the left upper jaw. She denies systemic symptoms. She has some swelling of her cheek lateral to this. No sublingual swelling  History reviewed. No pertinent past medical history. History reviewed. No pertinent past surgical history. Family History  Problem Relation Age of Onset  . Hypertension Mother   . Hypertension Other   . Diabetes Other    History  Substance Use Topics  . Smoking status: Current Every Day Smoker    Types: Cigarettes    Last Attempt to Quit: 11/21/2013  . Smokeless tobacco: Never Used  . Alcohol Use: No   OB History    No data available     Review of Systems  Constitutional: Negative for fever.  HENT: Positive for dental problem and facial swelling.   All other systems reviewed and are negative.   Allergies  Review of patient's allergies indicates no known allergies.  Home Medications   Prior to Admission medications   Medication Sig Start Date End Date Taking? Authorizing Provider  Aspirin-Acetaminophen (GOODY BODY PAIN) 500-325 MG PACK Take 1 Package by mouth every 8 (eight) hours as needed (pain).    Historical Provider, MD  clindamycin (CLEOCIN) 300 MG capsule Take 1 capsule (300 mg total) by mouth 3 (three) times daily. 07/24/14   Graylon GoodZachary H Shaleah Nissley, PA-C  HYDROcodone-acetaminophen (NORCO/VICODIN) 5-325 MG per tablet Take 1-2 tablets by mouth every 6 (six) hours as needed for moderate pain or severe pain. 06/04/14   Antony MaduraKelly  Humes, PA-C  ibuprofen (ADVIL,MOTRIN) 200 MG tablet Take 400 mg by mouth every 6 (six) hours as needed (for pain.).    Historical Provider, MD  naproxen (NAPROSYN) 500 MG tablet Take 1 tablet (500 mg total) by mouth 2 (two) times daily. 06/04/14   Antony MaduraKelly Humes, PA-C   BP 121/81 mmHg  Pulse 90  Temp(Src) 99.6 F (37.6 C) (Oral)  SpO2 100% Physical Exam  Constitutional: She is oriented to person, place, and time. Vital signs are normal. She appears well-developed and well-nourished. No distress.  HENT:  Head: Normocephalic and atraumatic.  Mouth/Throat: Abnormal dentition. Dental abscesses (left upper first canine) and dental caries present.  Pulmonary/Chest: Effort normal. No respiratory distress.  Neurological: She is alert and oriented to person, place, and time. She has normal strength. Coordination normal.  Skin: Skin is warm and dry. No rash noted. She is not diaphoretic.  Psychiatric: She has a normal mood and affect. Judgment normal.  Nursing note and vitals reviewed.   ED Course  Procedures (including critical care time) Labs Review Labs Reviewed - No data to display  Imaging Review No results found.   MDM   1. Dental abscess    Dental abscess, drained spontaneously. She has significant swelling around this, treat with convalescent. Follow-up with dentist ASAP    Graylon GoodZachary H Ocean Kearley, PA-C 07/24/14 1622

## 2014-07-24 NOTE — ED Notes (Signed)
Describes abscess in mouth w subsequent rupture of same

## 2014-08-03 ENCOUNTER — Emergency Department (HOSPITAL_COMMUNITY)
Admission: EM | Admit: 2014-08-03 | Discharge: 2014-08-03 | Disposition: A | Discharge status: Home / Self Care | Payer: 59 | Attending: Emergency Medicine | Admitting: Emergency Medicine

## 2014-08-03 ENCOUNTER — Encounter (HOSPITAL_COMMUNITY): Payer: Self-pay | Admitting: Emergency Medicine

## 2014-08-03 ENCOUNTER — Emergency Department (HOSPITAL_COMMUNITY): Payer: 59

## 2014-08-03 ENCOUNTER — Emergency Department (INDEPENDENT_AMBULATORY_CARE_PROVIDER_SITE_OTHER)
Admission: EM | Admit: 2014-08-03 | Discharge: 2014-08-03 | Disposition: A | Discharge status: Other Acute Inpatient Hospital | Payer: 59 | Source: Home / Self Care | Attending: Family Medicine | Admitting: Family Medicine

## 2014-08-03 ENCOUNTER — Encounter (HOSPITAL_COMMUNITY): Payer: Self-pay | Admitting: *Deleted

## 2014-08-03 DIAGNOSIS — Z792 Long term (current) use of antibiotics: Secondary | ICD-10-CM | POA: Insufficient documentation

## 2014-08-03 DIAGNOSIS — I499 Cardiac arrhythmia, unspecified: Secondary | ICD-10-CM | POA: Diagnosis not present

## 2014-08-03 DIAGNOSIS — K219 Gastro-esophageal reflux disease without esophagitis: Secondary | ICD-10-CM | POA: Diagnosis not present

## 2014-08-03 DIAGNOSIS — Z79899 Other long term (current) drug therapy: Secondary | ICD-10-CM | POA: Diagnosis not present

## 2014-08-03 DIAGNOSIS — Z720 Tobacco use: Secondary | ICD-10-CM | POA: Insufficient documentation

## 2014-08-03 DIAGNOSIS — K047 Periapical abscess without sinus: Secondary | ICD-10-CM | POA: Diagnosis not present

## 2014-08-03 DIAGNOSIS — Z791 Long term (current) use of non-steroidal anti-inflammatories (NSAID): Secondary | ICD-10-CM | POA: Insufficient documentation

## 2014-08-03 DIAGNOSIS — R079 Chest pain, unspecified: Secondary | ICD-10-CM | POA: Diagnosis present

## 2014-08-03 DIAGNOSIS — IMO0001 Reserved for inherently not codable concepts without codable children: Secondary | ICD-10-CM

## 2014-08-03 LAB — CBC
HEMATOCRIT: 36.3 % (ref 36.0–46.0)
Hemoglobin: 11.8 g/dL — ABNORMAL LOW (ref 12.0–15.0)
MCH: 25.3 pg — ABNORMAL LOW (ref 26.0–34.0)
MCHC: 32.5 g/dL (ref 30.0–36.0)
MCV: 77.7 fL — AB (ref 78.0–100.0)
Platelets: 283 10*3/uL (ref 150–400)
RBC: 4.67 MIL/uL (ref 3.87–5.11)
RDW: 14.6 % (ref 11.5–15.5)
WBC: 8.1 10*3/uL (ref 4.0–10.5)

## 2014-08-03 LAB — BASIC METABOLIC PANEL
Anion gap: 5 (ref 5–15)
BUN: 5 mg/dL — AB (ref 6–23)
CHLORIDE: 104 mmol/L (ref 96–112)
CO2: 28 mmol/L (ref 19–32)
CREATININE: 0.72 mg/dL (ref 0.50–1.10)
Calcium: 9 mg/dL (ref 8.4–10.5)
GFR calc non Af Amer: >90 mL/min (ref >90)
GLUCOSE: 114 mg/dL — AB (ref 70–99)
Potassium: 3.2 mmol/L — ABNORMAL LOW (ref 3.5–5.1)
Sodium: 137 mmol/L (ref 135–145)

## 2014-08-03 LAB — I-STAT TROPONIN, ED: Troponin i, poc: 0.01 ng/mL (ref 0.00–0.08)

## 2014-08-03 MED ORDER — GI COCKTAIL ~~LOC~~
30.0000 mL | Freq: Once | ORAL | Status: AC
  Administered 2014-08-03: 30 mL via ORAL
  Filled 2014-08-03: qty 30

## 2014-08-03 MED ORDER — PANTOPRAZOLE SODIUM 40 MG PO TBEC
40.0000 mg | DELAYED_RELEASE_TABLET | Freq: Once | ORAL | Status: AC
  Administered 2014-08-03: 40 mg via ORAL
  Filled 2014-08-03: qty 1

## 2014-08-03 NOTE — ED Notes (Signed)
Dr. White at bedside.

## 2014-08-03 NOTE — ED Notes (Signed)
Pt states she was taking clindamycin for an abscess tooth that caused diarrhea. Pt is still taking the antibiotic.

## 2014-08-03 NOTE — Discharge Instructions (Signed)
The pain you are having is most likely from the acid in your stomach/esophagus. Take a strong antacid like Omeprazole (Prilosec), Lansoprazole (Prevacid), Esomeprazole (Nexium) twice a day for the next week, then decrease to once a day. Take the pill about 20-30 minutes before you eat.  Take mylanta with each meal as well. If your pain does not improve, add ibuprofen in the next few days (  every 6 hours as needed), in the meantime continue to try tylenol  every 4 hours or  every 6 hours. Follow the diet recommendations below as well. There is information below for establishing with a primary doctor.   To help with your diarrhea, take a probiotic or eat yogurt.  Food Choices for Gastroesophageal Reflux Disease When you have gastroesophageal reflux disease (GERD), the foods you eat and your eating habits are very important. Choosing the right foods can help ease the discomfort of GERD. WHAT GENERAL GUIDELINES DO I NEED TO FOLLOW?  Choose fruits, vegetables, whole grains, low-fat dairy products, and low-fat meat, fish, and poultry.  Limit fats such as oils, salad dressings, butter, nuts, and avocado.  Keep a food diary to identify foods that cause symptoms.  Avoid foods that cause reflux. These may be different for different people.  Eat frequent small meals instead of three large meals each day.  Eat your meals slowly, in a relaxed setting.  Limit fried foods.  Cook foods using methods other than frying.  Avoid drinking alcohol.  Avoid drinking large amounts of liquids with your meals.  Avoid bending over or lying down until 2-3 hours after eating. WHAT FOODS ARE NOT RECOMMENDED? The following are some foods and drinks that may worsen your symptoms: Vegetables Tomatoes. Tomato juice. Tomato and spaghetti sauce. Chili peppers. Onion and garlic. Horseradish. Fruits Oranges, grapefruit, and lemon (fruit and juice). Meats High-fat meats, fish, and poultry. This includes  hot dogs, ribs, ham, sausage, salami, and bacon. Dairy Whole milk and chocolate milk. Sour cream. Cream. Butter. Ice cream. Cream cheese.  Beverages Coffee and tea, with or without caffeine. Carbonated beverages or energy drinks. Condiments Hot sauce. Barbecue sauce.  Sweets/Desserts Chocolate and cocoa. Donuts. Peppermint and spearmint. Fats and Oils High-fat foods, including [place] fries and potato chips. Other Vinegar. Strong spices, such as black pepper, white pepper, red pepper, cayenne, curry powder, cloves, ginger, and chili powder. The items listed above may not be a complete list of foods and beverages to avoid. Contact your dietitian for more information. Document Released: 06/09/2005 Document Revised: 06/14/2013 Document Reviewed: 04/13/2013 [facility] Patient Information 2015 [place], [place]. This information is not intended to replace advice given to you by your health care provider. Make sure you discuss any questions you have with your health care provider.  No Primary Care Doctor:  Call Health Connect at [phone] - they can help you locate a primary care doctor that accepts your insurance, provides certain services, etc.  Physician Referral [phone] Medication Assistance:  Organization Address Phone Notes  [facility] Medication Assistance Program  [address]  [place], [place] [address]  [phone]  --Must be a resident of [facility]  -- Must have NO insurance coverage whatsoever (no Medicaid/ Medicare, etc.)  -- The pt. MUST have a primary care doctor that directs their care regularly and follows them in the community   [facility]   [phone]    [employer]   [phone]    Agencies that provide inexpensive medical care:  Hydrologist  Notes  [facility]   [phone]    [facility]   [phone]    [facility]  [address]  [place],  [place] [address]  [phone]    [facility]  [address],  [place]  [phone]    [facility]   [phone]    [facility]   [phone]    [facility]  [address], [place]  Phone: [phone], Fax: [phone]  Hours of Operation: 9 am - 6 pm, M-F. Also accepts Medicaid/Medicare and self-pay.   [facility]  [address], [place]  Phone: [phone]) [phone], Fax: [phone].  Hours of Operation: 8:30 am - 5:30 pm, M-F. Also accepts Medicaid and self-pay.   [facility] [place]  [address], [place]  Phone: [phone](336) [phone]    [facility]  [address], [place], [place]  [phone], Ext. [phone]  Mondays & Thursdays: 7-9 AM. First 15 patients are seen on a first come, first serve basis.   Medicaid-accepting [facility] Providers:  Organization Address Phone Notes  [facility]  [address], [place]  [phone]  Also accepts self-pay patients.   [facility]  [address], [place]  [phone]    [facility]  [address], [place]  [phone]    [facility]  [address] [place] [address], [place]  [phone]    [facility]land  [address], [place]  [phone]  Only accepts [place] patients after they have their name applied to their card.   Self-Pay (no insurance) in [facility]:  Organization Address Phone Notes  Sickle Cell Patients, [facility]  [address], [place]  [phone]    [facility]  [address], [place]  [phone]    [facility] [place]  [address], [place]  [phone]    [facility]/Dr. Osei-Bonsu  [address] [place] [address], [place] or [id number], [place]  [phone]  Phone number for both [place] and [place] locations is the same.   [facility]  [address], [place]  [phone]    [facility]  [address] [place] [address], [place] or [address]  [phone]  [phone]    [facility]  [address], [place]  [phone], phone; [phone]) [phone], fax   Sees patients 1st and 3rd Saturday of every month. Must not qualify for public or private insurance (i.e. Medicaid, Medicare, [place] Health Choice, Veterans' Benefits)   Household income should be no more than 200% of the poverty level The clinic cannot treat you if you are pregnant or think you are pregnant  Sexually transmitted diseases are not treated at the clinic.

## 2014-08-03 NOTE — ED Provider Notes (Signed)
CSN: [id number]     Arrival date & time 08/03/14  1636 History   First MD Initiated Contact with Patient 08/03/14 1815     Chief Complaint  Patient presents with  . Chest Pain     (Consider location/radiation/quality/duration/timing/severity/associated sxs/prior Treatment) HPI Comments: Pt is a 29 y.o. female presenting with 3-4 day history of heartburn followed by persistent chest pain. PMH with recent tooth abscess just finishing antibiotic course (can't remember antibiotic, started with "C"). Pt states Monday night/tuesday morning woke with heartburn and took a tums, heartburn went away but continued to have sharp chest pains. Pain is stated to be constant, and worsens with movement, sitting up. She does not relate it exactly to walking/being physically active, it is present when walking up a hill but not necessarily any worse. The pain does not radiate. She had an episode of nausea and vomiting x 1. She has had diarrhea in the past week, recently treated for tooth abscess with antibiotics. She denies palpitations, heart racing, SOB, abdominal pain, dysuria. Does have PFH with heart attacks/heart disease, ?uncle in his 78s.  Patient is a 29 y.o. female presenting with chest pain. The history is provided by the patient. No language interpreter was used.  Chest Pain Pain location:  Substernal area and epigastric Pain quality: sharp   Pain radiates to:  Does not radiate Pain radiates to the back: no   Pain severity:  Moderate Onset quality:  Sudden (after episode of heartburn) Duration:  3 days Timing:  Constant Progression:  Unchanged Chronicity:  New Context: eating, movement and at rest   Relieved by:  Antacids Worsened by:  Certain positions Associated symptoms: heartburn, nausea and vomiting   Associated symptoms: no abdominal pain, no anorexia, no back pain, no cough, no dizziness, no dysphagia, no fever, no palpitations and no shortness of breath   Nausea:    Severity:   Mild Vomiting:    Quality:  Stomach contents   Number of occurrences:  1   History reviewed. No pertinent past medical history. History reviewed. No pertinent past surgical history. Family History  Problem Relation Age of Onset  . Hypertension Mother   . Hypertension Other   . Diabetes Other    History  Substance Use Topics  . Smoking status: Current Every Day Smoker    Types: Cigarettes    Last Attempt to Quit: 11/21/2013  . Smokeless tobacco: Never Used  . Alcohol Use: No   OB History    No data available     Review of Systems  Constitutional: Negative for fever and chills.  HENT: Positive for dental problem (recent tooth abscess). Negative for congestion, rhinorrhea, sore throat and trouble swallowing.   Respiratory: Negative for cough and shortness of breath.   Cardiovascular: Positive for chest pain. Negative for palpitations and leg swelling.  Gastrointestinal: Positive for heartburn, nausea, vomiting and diarrhea. Negative for abdominal pain, blood in stool and anorexia.  Genitourinary: Negative for dysuria.  Musculoskeletal: Negative for back pain.  Skin: Negative for rash.  Neurological: Negative for dizziness, syncope and light-headedness.  All other systems reviewed and are negative.     Allergies  Review of patient's allergies indicates no known allergies.  Home Medications   Prior to Admission medications   Medication Sig Start Date End Date Taking? Authorizing Provider  BIOTIN PO Take 1 tablet by mouth daily.   Yes Historical Provider, MD  clindamycin (CLEOCIN) 300 MG capsule Take 1 capsule (300 mg total) by mouth 3 (three) times  daily. 07/24/14  Yes Graylon Good, PA-C  Aspirin-Acetaminophen (GOODY BODY PAIN) 500-325 MG PACK Take 1 Package by mouth every 8 (eight) hours as needed (pain).    Historical Provider, MD  HYDROcodone-acetaminophen (NORCO/VICODIN) 5-325 MG per tablet Take 1-2 tablets by mouth every 6 (six) hours as needed for moderate pain or  severe pain. Patient not taking: Reported on 08/03/2014 06/04/14   Antony Madura, PA-C  ibuprofen (ADVIL,MOTRIN) 200 MG tablet Take 400 mg by mouth every 6 (six) hours as needed (for pain.).    Historical Provider, MD  naproxen (NAPROSYN) 500 MG tablet Take 1 tablet (500 mg total) by mouth 2 (two) times daily. Patient not taking: Reported on 08/03/2014 06/04/14   Antony Madura, PA-C   BP 123/74 mmHg  Pulse 81  Temp(Src) 98.4 F (36.9 C) (Oral)  Resp 20  Ht  (1.626 m)  Wt 185 lb (83.915 kg)  BMI 31.74 kg/m2  SpO2 100%  LMP 07/14/2014 Physical Exam  Constitutional: She is oriented to person, place, and time. She appears well-developed and well-nourished.  HENT:  Head: Normocephalic and atraumatic.  Eyes: EOM are normal. Pupils are equal, round, and reactive to light.  Cardiovascular: Normal rate, S1 normal, S2 normal, normal heart sounds and intact distal pulses.  A regularly irregular rhythm present.  Extrasystoles are present. Exam reveals no gallop and no friction rub.   No murmur heard. Pulmonary/Chest: Effort normal and breath sounds normal. No respiratory distress. She has no wheezes. She has no rales. She exhibits tenderness (area of xiphoid process).  Abdominal: Soft. She exhibits no distension.  Musculoskeletal: She exhibits no edema or tenderness.  Neurological: She is alert and oriented to person, place, and time.  Skin: Skin is warm and dry. No rash noted.  Psychiatric: She has a normal mood and affect. Her behavior is normal. Judgment and thought content normal.  Nursing note and vitals reviewed.   ED Course  Procedures (including critical care time) Labs Review Labs Reviewed  CBC - Abnormal; Notable for the following:    Hemoglobin 11.8 (*)    MCV 77.7 (*)    MCH 25.3 (*)    All other components within normal limits  BASIC METABOLIC PANEL - Abnormal; Notable for the following:    Potassium 3.2 (*)    Glucose, Bld 114 (*)    BUN 5 (*)    All other components  within normal limits  Rosezena Sensor, ED    Imaging Review Dg Chest 2 View  08/03/2014   CLINICAL DATA:  Chest pain for 2 days.  EXAM: CHEST  2 VIEW  COMPARISON:  None.  FINDINGS: Normal heart, mediastinum and hila lungs are clear. No pleural effusion or pneumothorax.  Bony thorax is unremarkable.  IMPRESSION: Normal chest radiographs.   Electronically Signed   By: Amie Portland M.D.   On: 08/03/2014 18:41     EKG Interpretation   Date/Time:  Thursday August 03 2014 16:53:20 EST Ventricular Rate:  96 PR Interval:  176 QRS Duration: 84 QT Interval:  364 QTC Calculation: 459 R Axis:   56 Text Interpretation:  Sinus rhythm with Premature atrial complexes Cannot  rule out Anterior infarct , age undetermined Abnormal ECG No significant  change since last tracing Confirmed by KNAPP  MD-J, JON ([id number]) on  08/03/2014 7:00:47 PM      MDM   Final diagnoses:  Reflux   No suspicion for cardiac etiology, i-stat trop 0.01. She does have PACs, however they are asymptomatic and  can be evaluated on outpatient basis. Improved with GI cocktail and protonix. Pt attempted to eat some food and drink a sprite which worsened the pain. Counseled regarding dietary choices for reflux, OTC PPI twice daily for next week. Return to UC for worsened or unrelieving pain over the next week. Pt requests discharge, in stable condition.    Nani RavensAndrew M Tashianna Broome, MD 08/03/14 2023  Linwood DibblesJon Knapp, MD 08/03/14 2025

## 2014-08-03 NOTE — ED Notes (Signed)
Pt in xray

## 2014-08-03 NOTE — ED Notes (Signed)
Pt reports periods of sharp chest pains since yesterday morning. Sent to ER from [facility] for further evaluation[facility]

## 2014-08-03 NOTE — ED Notes (Signed)
Pt in xray. Called xray - they will take pt to room after xray.

## 2014-08-03 NOTE — ED Notes (Addendum)
Pt reports    Pain   In  Chest  Sharp  In  [place]     Symptoms  Started    Yesterday  Am    With  Vomiting     As  Well  Pt taking  Clindamycin       For  A  Tooth  Infection   Awake  Alert  And  Oriented  Skin  Is  Warm  And  Dry  Pt  Speaking  In  Complete  sentances

## 2014-08-03 NOTE — ED Provider Notes (Signed)
CSN: [id number]     Arrival date & time 08/03/14  1456 History   First MD Initiated Contact with Patient 08/03/14 1524     Chief Complaint  Patient presents with  . Chest Pain   (Consider location/radiation/quality/duration/timing/severity/associated sxs/prior Treatment) HPI Comments: No significant family hx for heart disease  Patient is a 29 y.o. female presenting with chest pain. The history is provided by the patient.  Chest Pain Pain location:  Substernal area (near lower pole of sternum) Pain quality: burning and sharp   Pain radiates to:  Does not radiate Pain radiates to the back: no   Pain severity:  Moderate Onset quality:  Gradual Duration:  2 days Timing:  Intermittent Progression:  Waxing and waning (with episodes occurring every 20-30 minutes and lasting several minutes) Chronicity:  New Relieved by:  Antacids (Endorses some transient relief of burning sensation with use of TUMS) Worsened by:  Exertion (along with eating and drinking ) Associated symptoms: heartburn and vomiting   Associated symptoms: no abdominal pain, no anxiety, no back pain, no cough, no diaphoresis, no dizziness, no fatigue, no fever, no nausea, no near-syncope, no orthopnea, no palpitations, no PND, no shortness of breath, no syncope and no weakness   Associated symptoms comment:  One episode of vomiting 2 days ago Risk factors: obesity and smoking     History reviewed. No pertinent past medical history. History reviewed. No pertinent past surgical history. Family History  Problem Relation Age of Onset  . Hypertension Mother   . Hypertension Other   . Diabetes Other    History  Substance Use Topics  . Smoking status: Current Every Day Smoker    Types: Cigarettes    Last Attempt to Quit: 11/21/2013  . Smokeless tobacco: Never Used  . Alcohol Use: No   OB History    No data available     Review of Systems  Constitutional: Negative for fever, diaphoresis, appetite change and  fatigue.  HENT: Negative.   Respiratory: Negative for cough, chest tightness, shortness of breath, wheezing and stridor.   Cardiovascular: Positive for chest pain. Negative for palpitations, orthopnea, leg swelling, syncope, PND and near-syncope.  Gastrointestinal: Positive for heartburn and vomiting. Negative for nausea, abdominal pain, diarrhea and constipation.  Genitourinary: Negative.   Musculoskeletal: Negative for back pain.  Skin: Negative.   Neurological: Negative for dizziness, syncope, weakness and light-headedness.    Allergies  Review of patient's allergies indicates no known allergies.  Home Medications   Prior to Admission medications   Medication Sig Start Date End Date Taking? Authorizing Provider  Aspirin-Acetaminophen (GOODY BODY PAIN) 500-325 MG PACK Take 1 Package by mouth every 8 (eight) hours as needed (pain).    Historical Provider, MD  clindamycin (CLEOCIN) 300 MG capsule Take 1 capsule (300 mg total) by mouth 3 (three) times daily. 07/24/14   Graylon GoodZachary H Baker, PA-C  HYDROcodone-acetaminophen (NORCO/VICODIN) 5-325 MG per tablet Take 1-2 tablets by mouth every 6 (six) hours as needed for moderate pain or severe pain. 06/04/14   Antony MaduraKelly Humes, PA-C  ibuprofen (ADVIL,MOTRIN) 200 MG tablet Take 400 mg by mouth every 6 (six) hours as needed (for pain.).    Historical Provider, MD  naproxen (NAPROSYN) 500 MG tablet Take 1 tablet (500 mg total) by mouth 2 (two) times daily. 06/04/14   Antony MaduraKelly Humes, PA-C   BP 142/87 mmHg  Pulse 92  Temp(Src) 98.9 F (37.2 C) (Oral)  Resp 14  SpO2 100%  LMP 07/14/2014 Physical Exam  Constitutional: She  is oriented to person, place, and time. She appears well-developed and well-nourished. No distress.  HENT:  Head: Normocephalic and atraumatic.  Eyes: Conjunctivae are normal. No scleral icterus.  Neck: Normal range of motion. Neck supple.  Cardiovascular: Normal rate, regular rhythm and normal heart sounds.   Pulmonary/Chest: Effort  normal and breath sounds normal. No respiratory distress. She has no wheezes.  Abdominal: Soft. Normal appearance and bowel sounds are normal. There is no tenderness.  Musculoskeletal: Normal range of motion. She exhibits no edema or tenderness.  Neurological: She is alert and oriented to person, place, and time.  Skin: Skin is warm and dry.  Psychiatric: She has a normal mood and affect. Her behavior is normal.  Nursing note and vitals reviewed.   ED Course  Procedures (including critical care time) Labs Review Labs Reviewed - No data to display  Imaging Review No results found.   MDM   1. Chest pain, unspecified chest pain type   ECG: no previous tracings available for comparison. NSR at 94 bpm with normal PR interval ( ) and normal (for female) QTc ( ). However, on 1 minute rhythm strip, patient has a total of 11 ectopic/ junctional beats. No STEMI. No T wave inversion.  History suggests possible reflux esophagitis, however, patient is very clear that symptoms are made noticeably worse each time she climbs stairs at worse and have persisted despite multiple doses of antacids. Will transfer to [facility] ER for further evaluation.     Ria Clock, [place] 08/03/14 [phone]

## 2017-12-27 ENCOUNTER — Encounter (HOSPITAL_COMMUNITY): Payer: Self-pay | Admitting: Emergency Medicine

## 2017-12-27 ENCOUNTER — Emergency Department (HOSPITAL_COMMUNITY)
Admission: EM | Admit: 2017-12-27 | Discharge: 2017-12-28 | Disposition: A | Discharge status: Home / Self Care | Payer: 59 | Attending: Emergency Medicine | Admitting: Emergency Medicine

## 2017-12-27 DIAGNOSIS — G44319 Acute post-traumatic headache, not intractable: Secondary | ICD-10-CM

## 2017-12-27 DIAGNOSIS — S39012A Strain of muscle, fascia and tendon of lower back, initial encounter: Secondary | ICD-10-CM | POA: Insufficient documentation

## 2017-12-27 DIAGNOSIS — Y999 Unspecified external cause status: Secondary | ICD-10-CM | POA: Diagnosis not present

## 2017-12-27 DIAGNOSIS — F17210 Nicotine dependence, cigarettes, uncomplicated: Secondary | ICD-10-CM | POA: Insufficient documentation

## 2017-12-27 DIAGNOSIS — I671 Cerebral aneurysm, nonruptured: Secondary | ICD-10-CM

## 2017-12-27 DIAGNOSIS — I728 Aneurysm of other specified arteries: Secondary | ICD-10-CM | POA: Diagnosis not present

## 2017-12-27 DIAGNOSIS — S0990XA Unspecified injury of head, initial encounter: Secondary | ICD-10-CM | POA: Diagnosis present

## 2017-12-27 DIAGNOSIS — Y9389 Activity, other specified: Secondary | ICD-10-CM | POA: Insufficient documentation

## 2017-12-27 DIAGNOSIS — Y92410 Unspecified street and highway as the place of occurrence of the external cause: Secondary | ICD-10-CM | POA: Insufficient documentation
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
## 2017-12-28 ENCOUNTER — Emergency Department (HOSPITAL_COMMUNITY): Payer: 59

## 2017-12-28 LAB — I-STAT CHEM 8, ED
BUN: 6 mg/dL (ref 6–20)
CREATININE: 1 mg/dL (ref 0.44–1.00)
Calcium, Ion: 1.24 mmol/L (ref 1.15–1.40)
Chloride: 106 mmol/L (ref 98–111)
Glucose, Bld: 108 mg/dL — ABNORMAL HIGH (ref 70–99)
HEMATOCRIT: 34 % — AB (ref 36.0–46.0)
Hemoglobin: 11.6 g/dL — ABNORMAL LOW (ref 12.0–15.0)
Potassium: 3.7 mmol/L (ref 3.5–5.1)
Sodium: 142 mmol/L (ref 135–145)
TCO2: 27 mmol/L (ref 22–32)

## 2017-12-28 LAB — I-STAT BETA HCG BLOOD, ED (MC, WL, AP ONLY)

## 2017-12-28 MED ORDER — ONDANSETRON HCL 4 MG/2ML IJ SOLN
4.0000 mg | Freq: Once | INTRAMUSCULAR | Status: AC
  Administered 2017-12-28: 4 mg via INTRAVENOUS
  Filled 2017-12-28: qty 2

## 2017-12-28 MED ORDER — IOPAMIDOL (ISOVUE-370) INJECTION 76%
INTRAVENOUS | Status: AC
  Filled 2017-12-28: qty 50

## 2017-12-28 MED ORDER — IOHEXOL 300 MG/ML  SOLN
100.0000 mL | Freq: Once | INTRAMUSCULAR | Status: AC | PRN
  Administered 2017-12-28: 75 mL via INTRAVENOUS

## 2017-12-28 MED ORDER — IOPAMIDOL (ISOVUE-370) INJECTION 76%
50.0000 mL | Freq: Once | INTRAVENOUS | Status: AC | PRN
  Administered 2017-12-28: 50 mL via INTRAVENOUS

## 2017-12-28 MED ORDER — METHOCARBAMOL 500 MG PO TABS: 500.0000 mg | ORAL_TABLET | Freq: Two times a day (BID) | ORAL | 0 refills | Status: DC

## 2017-12-28 MED ORDER — FENTANYL CITRATE (PF) 100 MCG/2ML IJ SOLN
50.0000 ug | Freq: Once | INTRAMUSCULAR | Status: AC
  Administered 2017-12-28: 50 ug via INTRAVENOUS
  Filled 2017-12-28: qty 2

## 2017-12-28 MED ORDER — NAPROXEN 500 MG PO TABS: 500.0000 mg | ORAL_TABLET | Freq: Two times a day (BID) | ORAL | 0 refills | Status: DC

## 2017-12-28 NOTE — ED Notes (Signed)
Patient verbalizes medications and discharge instructions. No further questions at this time. VSS and patient ambulatory at discharge.
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
## 2017-12-28 NOTE — ED Notes (Signed)
Patient transported to CT 

## 2017-12-28 NOTE — Discharge Instructions (Addendum)
1. Medications: robaxin, naproxyn, usual home medications 2. Treatment: rest, drink plenty of fluids, gentle stretching as discussed, alternate ice and heat 3. Follow Up: Please followup with your primary doctor in 3 days and [facility] in 1 week for discussion of your diagnoses and further evaluation after today's visit; Return to the ER for worsening back pain, difficulty walking, loss of bowel or bladder control, no onset headache, vision changes, vomiting or other concerning symptoms

## 2018-01-05 ENCOUNTER — Other Ambulatory Visit: Payer: Self-pay | Admitting: Neurosurgery

## 2018-01-05 DIAGNOSIS — I671 Cerebral aneurysm, nonruptured: Secondary | ICD-10-CM

## 2018-01-11 ENCOUNTER — Other Ambulatory Visit: Payer: Self-pay | Admitting: Neurosurgery
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
## 2018-01-12 ENCOUNTER — Other Ambulatory Visit: Payer: Self-pay | Admitting: Neurosurgery

## 2018-01-12 ENCOUNTER — Encounter (HOSPITAL_COMMUNITY): Payer: Self-pay | Admitting: Neurosurgery

## 2018-01-12 ENCOUNTER — Ambulatory Visit (HOSPITAL_COMMUNITY)
Admission: RE | Admit: 2018-01-12 | Discharge: 2018-01-12 | Disposition: A | Discharge status: Home / Self Care | Payer: 59 | Source: Ambulatory Visit | Attending: Neurosurgery | Admitting: Neurosurgery

## 2018-01-12 ENCOUNTER — Other Ambulatory Visit: Payer: Self-pay

## 2018-01-12 DIAGNOSIS — Z7982 Long term (current) use of aspirin: Secondary | ICD-10-CM | POA: Diagnosis not present

## 2018-01-12 DIAGNOSIS — I671 Cerebral aneurysm, nonruptured: Secondary | ICD-10-CM

## 2018-01-12 DIAGNOSIS — F17210 Nicotine dependence, cigarettes, uncomplicated: Secondary | ICD-10-CM | POA: Insufficient documentation

## 2018-01-12 HISTORY — PX: IR US GUIDE VASC ACCESS RIGHT: IMG2390

## 2018-01-12 HISTORY — PX: IR ANGIO INTRA EXTRACRAN SEL INTERNAL CAROTID BILAT MOD SED: IMG5363

## 2018-01-12 HISTORY — PX: IR ANGIO VERTEBRAL SEL VERTEBRAL BILAT MOD SED: IMG5369

## 2018-01-12 HISTORY — PX: IR 3D INDEPENDENT WKST: IMG2385

## 2018-01-12 LAB — CBC WITH DIFFERENTIAL/PLATELET
Abs Immature Granulocytes: 0 10*3/uL (ref 0.0–0.1)
BASOS ABS: 0.1 10*3/uL (ref 0.0–0.1)
BASOS PCT: 1 %
EOS ABS: 0.2 10*3/uL (ref 0.0–0.7)
EOS PCT: 3 %
HCT: 38.4 % (ref 36.0–46.0)
Hemoglobin: 11.9 g/dL — ABNORMAL LOW (ref 12.0–15.0)
Immature Granulocytes: 0 %
Lymphocytes Relative: 50 %
Lymphs Abs: 3.2 10*3/uL (ref 0.7–4.0)
MCH: 25.1 pg — ABNORMAL LOW (ref 26.0–34.0)
MCHC: 31 g/dL (ref 30.0–36.0)
MCV: 81 fL (ref 78.0–100.0)
Monocytes Absolute: 0.4 10*3/uL (ref 0.1–1.0)
Monocytes Relative: 6 %
Neutro Abs: 2.5 10*3/uL (ref 1.7–7.7)
Neutrophils Relative %: 40 %
PLATELETS: 245 10*3/uL (ref 150–400)
RBC: 4.74 MIL/uL (ref 3.87–5.11)
RDW: 14.5 % (ref 11.5–15.5)
WBC: 6.3 10*3/uL (ref 4.0–10.5)

## 2018-01-12 LAB — PROTIME-INR
INR: 1.12
Prothrombin Time: 14.3 s (ref 11.4–15.2)

## 2018-01-12 LAB — BASIC METABOLIC PANEL
Anion gap: 6 (ref 5–15)
BUN: 6 mg/dL (ref 6–20)
CALCIUM: 9.2 mg/dL (ref 8.9–10.3)
CO2: 23 mmol/L (ref 22–32)
Chloride: 112 mmol/L — ABNORMAL HIGH (ref 98–111)
Creatinine, Ser: 0.85 mg/dL (ref 0.44–1.00)
GFR calc non Af Amer: >60 mL/min (ref >60)
Glucose, Bld: 109 mg/dL — ABNORMAL HIGH (ref 70–99)
Potassium: 4 mmol/L (ref 3.5–5.1)
SODIUM: 141 mmol/L (ref 135–145)

## 2018-01-12 LAB — APTT: APTT: 30 s (ref 24–36)

## 2018-01-12 MED ORDER — IOHEXOL 300 MG/ML  SOLN
150.0000 mL | Freq: Once | INTRAMUSCULAR | Status: AC | PRN
  Administered 2018-01-12: 29 mL via INTRA_ARTERIAL

## 2018-01-12 MED ORDER — IOPAMIDOL (ISOVUE-300) INJECTION 61%
INTRAVENOUS | Status: AC
  Administered 2018-01-12: 21 mL
  Filled 2018-01-12: qty 100

## 2018-01-12 MED ORDER — LIDOCAINE HCL 1 % IJ SOLN
INTRAMUSCULAR | Status: AC
  Filled 2018-01-12: qty 20

## 2018-01-12 MED ORDER — SODIUM CHLORIDE 0.9 % IV SOLN
INTRAVENOUS | Status: AC | PRN
  Administered 2018-01-12: 10 mL/h via INTRAVENOUS

## 2018-01-12 MED ORDER — LORAZEPAM 2 MG/ML IJ SOLN
INTRAMUSCULAR | Status: AC | PRN
  Administered 2018-01-12: 0.5 mg via INTRAVENOUS

## 2018-01-12 MED ORDER — HEPARIN SODIUM (PORCINE) 1000 UNIT/ML IJ SOLN
INTRAMUSCULAR | Status: AC
  Filled 2018-01-12: qty 1

## 2018-01-12 MED ORDER — CEFAZOLIN SODIUM-DEXTROSE 2-4 GM/100ML-% IV SOLN: 2.0000 g | INTRAVENOUS | Status: DC

## 2018-01-12 MED ORDER — SODIUM CHLORIDE 0.9 % IV SOLN: INTRAVENOUS | Status: DC

## 2018-01-12 MED ORDER — LIDOCAINE HCL (PF) 1 % IJ SOLN
INTRAMUSCULAR | Status: AC | PRN
  Administered 2018-01-12: 10 mL

## 2018-01-12 MED ORDER — FENTANYL CITRATE (PF) 100 MCG/2ML IJ SOLN
INTRAMUSCULAR | Status: AC
  Filled 2018-01-12: qty 4

## 2018-01-12 MED ORDER — FENTANYL CITRATE (PF) 100 MCG/2ML IJ SOLN
INTRAMUSCULAR | Status: AC | PRN
  Administered 2018-01-12: 25 ug via INTRAVENOUS

## 2018-01-12 MED ORDER — HYDROCODONE-ACETAMINOPHEN 5-325 MG PO TABS: 1.0000 | ORAL_TABLET | ORAL | Status: DC | PRN

## 2018-01-12 MED ORDER — HEPARIN SODIUM (PORCINE) 1000 UNIT/ML IJ SOLN
INTRAMUSCULAR | Status: AC | PRN
  Administered 2018-01-12: 2000 [IU] via INTRAVENOUS

## 2018-01-12 MED ORDER — MIDAZOLAM HCL 2 MG/2ML IJ SOLN
INTRAMUSCULAR | Status: AC
  Filled 2018-01-12: qty 4

## 2018-01-12 NOTE — Discharge Instructions (Addendum)
Cerebral Angiogram, Care After °Refer to this sheet in the next few weeks. These instructions provide you with information on caring for yourself after your procedure. Your health care provider may also give you more specific instructions. Your treatment has been planned according to current medical practices, but problems sometimes occur. Call your health care provider if you have any problems or questions after your procedure. °What can I expect after the procedure? °After your procedure, it is typical to have the following: °· Bruising at the catheter insertion site that usually fades within 1-2 weeks. °· Blood collecting in the tissue (hematoma) that may be painful to the touch. It should usually decrease in size and tenderness within 1-2 weeks. °· A mild headache. ° °Follow these instructions at home: °· Take medicines only as directed by your health care provider. °· You may shower 24-48 hours after the procedure or as directed by your health care provider. Remove the bandage (dressing) and gently wash the site with plain soap and water. Pat the area dry with a clean towel. Do not rub the site, because this may cause bleeding. °· Do not take baths, swim, or use a hot tub until your health care provider approves. °· Check your insertion site every day for redness, swelling, or drainage. °· Do not apply powder or lotion to the site. °· Do not lift over 10 lb (4.5 kg) for 5 days after your procedure or as directed by your health care provider. °· Ask your health care provider when it is okay to: °? Return to work or school. °? Resume usual physical activities or sports. °? Resume sexual activity. °· Do not drive home if you are discharged the same day as the procedure. Have someone else drive you. °· You may drive 24 hours after the procedure unless otherwise instructed by your health care provider. °· Do not operate machinery or power tools for 24 hours after the procedure or as directed by your health care  provider. °· If your procedure was done as an outpatient procedure, which means that you went home the same day as your procedure, a responsible adult should be with you for the first 24 hours after you arrive home. °· Keep all follow-up visits as directed by your health care provider. This is important. °Contact a health care provider if: °· You have a fever. °· You have chills. °· You have increased bleeding from the catheter insertion site. Hold pressure on the site. °Get help right away if: °· You have vision changes or loss of vision. °· You have numbness or weakness on one side of your body. °· You have difficulty talking, or you have slurred speech or cannot speak (aphasia). °· You feel confused or have difficulty remembering. °· You have unusual pain at the catheter insertion site. °· You have redness, warmth, or swelling at the catheter insertion site. °· You have drainage (other than a small amount of blood on the dressing) from the catheter insertion site. °· The catheter insertion site is bleeding, and the bleeding does not stop after 30 minutes of holding steady pressure on the site. °These symptoms may represent a serious problem that is an emergency. Do not wait to see if the symptoms will go away. Get medical help right away. Call your local emergency services (911 in [place] Do not drive yourself to the hospital. °This information is not intended to replace advice given to you by your health care provider. Make sure you discuss any questions   you have with your health care provider. °Document Released: 10/24/2013 Document Revised: 11/15/2015 Document Reviewed: 06/22/2013 °Elsevier Interactive Patient Education © 2017 [employer] °Moderate Conscious Sedation, Adult, Care After °These instructions provide you with information about caring for yourself after your procedure. Your health care provider may also give you more specific instructions. Your treatment has been planned according to current  medical practices, but problems sometimes occur. Call your health care provider if you have any problems or questions after your procedure. °What can I expect after the procedure? °After your procedure, it is common: °· To feel sleepy for several hours. °· To feel clumsy and have poor balance for several hours. °· To have poor judgment for several hours. °· To vomit if you eat too soon. ° °Follow these instructions at home: °For at least 24 hours after the procedure: ° °· Do not: °? Participate in activities where you could fall or become injured. °? Drive. °? Use heavy machinery. °? Drink alcohol. °? Take sleeping pills or medicines that cause drowsiness. °? Make important decisions or sign legal documents. °? Take care of children on your own. °· Rest. °Eating and drinking °· Follow the diet recommended by your health care provider. °· If you vomit: °? Drink water, juice, or soup when you can drink without vomiting. °? Make sure you have little or no nausea before eating solid foods. °General instructions °· Have a responsible adult stay with you until you are awake and alert. °· Take over-the-counter and prescription medicines only as told by your health care provider. °· If you smoke, do not smoke without supervision. °· Keep all follow-up visits as told by your health care provider. This is important. °Contact a health care provider if: °· You keep feeling nauseous or you keep vomiting. °· You feel light-headed. °· You develop a rash. °· You have a fever. °Get help right away if: °· You have trouble breathing. °This information is not intended to replace advice given to you by your health care provider. Make sure you discuss any questions you have with your health care provider. °Document Released: 03/30/2013 Document Revised: 11/12/2015 Document Reviewed: 09/29/2015 °Elsevier Interactive Patient Education © 2018 Elsevier Inc. ° °

## 2018-01-28 ENCOUNTER — Other Ambulatory Visit (HOSPITAL_COMMUNITY): Payer: Self-pay | Admitting: Neurosurgery

## 2018-01-28 ENCOUNTER — Other Ambulatory Visit: Payer: Self-pay | Admitting: Neurosurgery

## 2018-01-28 DIAGNOSIS — I671 Cerebral aneurysm, nonruptured: Secondary | ICD-10-CM

## 2018-01-29 ENCOUNTER — Inpatient Hospital Stay (HOSPITAL_COMMUNITY): Admission: RE | Admit: 2018-01-29 | Payer: 59 | Source: Ambulatory Visit

## 2018-02-24 ENCOUNTER — Other Ambulatory Visit: Payer: Self-pay | Admitting: Neurosurgery

## 2018-02-26 NOTE — Pre-Procedure Instructions (Signed)
ESRA BARKS  02/26/2018      [id number] [place], [place] - [address] [place] [address] Phone: [phone] Fax: [phone]    Your procedure is scheduled on March 09, 2018.  Report to [facility] Admitting at 945 AM.  Call this number if you have problems the morning of surgery:  [phone]   Remember:  Do not eat or drink after midnight.    Take these medicines the morning of surgery with A SIP OF WATER -NONE  Follow your surgeon's instructions on when to hold/resume aspirin/plavix.  If no instructions were given call the office to determine how they would like to you take aspirin/plavix  7 days prior to surgery STOP taking any  Aleve, Naproxen, Ibuprofen, Motrin, Advil, Goody's, BC's, all herbal medications, fish oil, and all vitamins   Do not wear jewelry, make-up or nail polish.  Do not wear lotions, powders, or perfumes, or deodorant.  Do not shave 48 hours prior to surgery.    Do not bring valuables to the hospital.   [facility] is not responsible for any belongings or valuables.  Contacts, dentures or bridgework may not be worn into surgery.  Leave your suitcase in the car.  After surgery it may be brought to your room.  For patients admitted to the hospital, discharge time will be determined by your treatment team.  Patients discharged the day of surgery will not be allowed to drive home.   [facility]- Preparing For Surgery  Before surgery, you can play an important role. Because skin is not sterile, your skin needs to be as free of germs as possible. You can reduce the number of germs on your skin by washing with CHG (chlorahexidine gluconate) Soap before surgery.  CHG is an antiseptic cleaner which kills germs and bonds with the skin to continue killing germs even after washing.    Oral Hygiene is also important to reduce your risk of infection.  Remember - BRUSH YOUR TEETH THE MORNING OF  SURGERY WITH YOUR REGULAR TOOTHPASTE  Please do not use if you have an allergy to CHG or antibacterial soaps. If your skin becomes reddened/irritated stop using the CHG.  Do not shave (including legs and underarms) for at least 48 hours prior to first CHG shower. It is OK to shave your face.  Please follow these instructions carefully.   1. Shower the NIGHT BEFORE SURGERY and the MORNING OF SURGERY with CHG.   2. If you chose to wash your hair, wash your hair first as usual with your normal shampoo.  3. After you shampoo, rinse your hair and body thoroughly to remove the shampoo.  4. Use CHG as you would any other liquid soap. You can apply CHG directly to the skin and wash gently with a scrungie or a clean washcloth.   5. Apply the CHG Soap to your body ONLY FROM THE NECK DOWN.  Do not use on open wounds or open sores. Avoid contact with your eyes, ears, mouth and genitals (private parts). Wash Face and genitals (private parts)  with your normal soap.  6. Wash thoroughly, paying special attention to the area where your surgery will be performed.  7. Thoroughly rinse your body with warm water from the neck down.  8. DO NOT shower/wash with your normal soap after using and rinsing off the CHG Soap.  9. Pat yourself dry with a CLEAN TOWEL.  10. Wear CLEAN  PAJAMAS to bed the night before surgery, wear comfortable clothes the morning of surgery  11. Place CLEAN SHEETS on your bed the night of your first shower and DO NOT SLEEP WITH PETS.  Day of Surgery:  Do not apply any deodorants/lotions.  Please wear clean clothes to the hospital/surgery center.   Remember to brush your teeth WITH YOUR REGULAR TOOTHPASTE.   Please read over the  fact sheets that you were given.

## 2018-03-01 ENCOUNTER — Encounter (HOSPITAL_COMMUNITY)
Admission: RE | Admit: 2018-03-01 | Discharge: 2018-03-01 | Disposition: A | Discharge status: Home / Self Care | Payer: 59 | Source: Ambulatory Visit | Attending: Neurosurgery | Admitting: Neurosurgery

## 2018-03-01 ENCOUNTER — Encounter (HOSPITAL_COMMUNITY): Payer: Self-pay

## 2018-03-01 DIAGNOSIS — Z79899 Other long term (current) drug therapy: Secondary | ICD-10-CM | POA: Insufficient documentation

## 2018-03-01 DIAGNOSIS — Z7982 Long term (current) use of aspirin: Secondary | ICD-10-CM | POA: Insufficient documentation

## 2018-03-01 DIAGNOSIS — I671 Cerebral aneurysm, nonruptured: Secondary | ICD-10-CM | POA: Diagnosis not present

## 2018-03-01 DIAGNOSIS — R000 Tachycardia, unspecified: Secondary | ICD-10-CM | POA: Diagnosis not present

## 2018-03-01 DIAGNOSIS — I444 Left anterior fascicular block: Secondary | ICD-10-CM | POA: Diagnosis not present

## 2018-03-01 DIAGNOSIS — Z7902 Long term (current) use of antithrombotics/antiplatelets: Secondary | ICD-10-CM | POA: Insufficient documentation

## 2018-03-01 DIAGNOSIS — Z01818 Encounter for other preprocedural examination: Secondary | ICD-10-CM | POA: Diagnosis not present

## 2018-03-01 DIAGNOSIS — I10 Essential (primary) hypertension: Secondary | ICD-10-CM | POA: Diagnosis not present

## 2018-03-01 DIAGNOSIS — Z791 Long term (current) use of non-steroidal anti-inflammatories (NSAID): Secondary | ICD-10-CM | POA: Insufficient documentation

## 2018-03-01 HISTORY — DX: Headache: R51

## 2018-03-01 HISTORY — DX: Essential (primary) hypertension: I10

## 2018-03-01 HISTORY — DX: Headache, unspecified: R51.9

## 2018-03-01 HISTORY — DX: Anxiety disorder, unspecified: F41.9

## 2018-03-01 LAB — URINALYSIS, COMPLETE (UACMP) WITH MICROSCOPIC
Bilirubin Urine: NEGATIVE
Glucose, UA: NEGATIVE mg/dL
KETONES UR: NEGATIVE mg/dL
Leukocytes, UA: NEGATIVE
Nitrite: NEGATIVE
PROTEIN: NEGATIVE mg/dL
Specific Gravity, Urine: 1.004 — ABNORMAL LOW (ref 1.005–1.030)
pH: 7 (ref 5.0–8.0)

## 2018-03-01 LAB — PROTIME-INR
INR: 1.17
Prothrombin Time: 14.8 seconds (ref 11.4–15.2)

## 2018-03-01 LAB — BASIC METABOLIC PANEL
Anion gap: 10 (ref 5–15)
BUN: 9 mg/dL (ref 6–20)
CHLORIDE: 109 mmol/L (ref 98–111)
CO2: 21 mmol/L — AB (ref 22–32)
CREATININE: 0.9 mg/dL (ref 0.44–1.00)
Calcium: 9.2 mg/dL (ref 8.9–10.3)
GFR calc Af Amer: >60 mL/min (ref >60)
GFR calc non Af Amer: >60 mL/min (ref >60)
Glucose, Bld: 111 mg/dL — ABNORMAL HIGH (ref 70–99)
Potassium: 3.9 mmol/L (ref 3.5–5.1)
SODIUM: 140 mmol/L (ref 135–145)

## 2018-03-01 LAB — CBC WITH DIFFERENTIAL/PLATELET
Abs Immature Granulocytes: 0 10*3/uL (ref 0.0–0.1)
Basophils Absolute: 0 10*3/uL (ref 0.0–0.1)
Basophils Relative: 1 %
EOS ABS: 0.1 10*3/uL (ref 0.0–0.7)
EOS PCT: 1 %
HEMATOCRIT: 37.9 % (ref 36.0–46.0)
Hemoglobin: 11.7 g/dL — ABNORMAL LOW (ref 12.0–15.0)
Immature Granulocytes: 0 %
LYMPHS ABS: 2.4 10*3/uL (ref 0.7–4.0)
Lymphocytes Relative: 45 %
MCH: 25.2 pg — AB (ref 26.0–34.0)
MCHC: 30.9 g/dL (ref 30.0–36.0)
MCV: 81.5 fL (ref 78.0–100.0)
Monocytes Absolute: 0.3 10*3/uL (ref 0.1–1.0)
Monocytes Relative: 6 %
Neutro Abs: 2.6 10*3/uL (ref 1.7–7.7)
Neutrophils Relative %: 47 %
Platelets: 264 10*3/uL (ref 150–400)
RBC: 4.65 MIL/uL (ref 3.87–5.11)
RDW: 14.6 % (ref 11.5–15.5)
WBC: 5.5 10*3/uL (ref 4.0–10.5)

## 2018-03-01 LAB — APTT: aPTT: 29 seconds (ref 24–36)

## 2018-03-01 MED ORDER — CHLORHEXIDINE GLUCONATE CLOTH 2 % EX PADS: 6.0000 | MEDICATED_PAD | Freq: Once | CUTANEOUS | Status: DC

## 2018-03-01 NOTE — Progress Notes (Signed)
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 Pertinent hx includes HA, Anxiety.  Pt does not have diagnosis of HTN but at PAT appt today her BP was 165/115 after multiple rechecks. She denies having a PCP, denies every being told her BP was elevated. I advised her that she needs to establish with PCP for management of HTN and that her current diastolic pressures may preclude surgery. I also advised her to contact Dr. Val Riles office to discuss how he would like to proceed given her untreated HTN. She verbalized understanding.  I spoke with Lowella Bandy at [facility] and she said that she would discuss with Dr. Conchita Paris regarding how to proceed.   Surgery was originally scheduled 03/09/2018, as of now it appears it has been rescheduled for 03/23/2018.  Anticipate she can proceed with surgery if BP managed and acceptable on DOS.  VS: BP (!) 165/115   Pulse (!) 109   Temp 37.1 C (Oral)   Resp 18   Ht 5' 3.5" (1.613 m)   Wt 92.1 kg   LMP 03/01/2018   SpO2 100%   BMI 35.41 kg/m   PROVIDERS: Denies, Per Pt   LABS: Labs reviewed: Acceptable for surgery. (all labs ordered are listed, but only abnormal results are displayed)  Labs Reviewed  BASIC METABOLIC PANEL - Abnormal; Notable for the following components:      Result Value   CO2 21 (*)    Glucose, Bld 111 (*)    All other components within normal limits  CBC WITH DIFFERENTIAL/PLATELET - Abnormal; Notable for the following components:   Hemoglobin 11.7 (*)    MCH 25.2 (*)    All other components within normal limits  URINALYSIS, COMPLETE (UACMP) WITH MICROSCOPIC - Abnormal; Notable for the following components:   Color,  Urine STRAW (*)    Specific Gravity, Urine 1.004 (*)    Hgb urine dipstick MODERATE (*)    Bacteria, UA RARE (*)    All other components within normal limits  APTT  PROTIME-INR     IMAGES: CHEST  2 VIEW 08/03/2014  COMPARISON:  None.  FINDINGS: Normal heart, mediastinum and hila lungs are clear. No pleural effusion or pneumothorax.  Bony thorax is unremarkable.  IMPRESSION: Normal chest radiographs.   EKG: 03/01/2018: Sinus tachycardia with Premature atrial complexes (101bpm)  CV: N/A  Past Medical History:  Diagnosis Date  . Anxiety   . Headache   . Hypertension    no meds ..elevated at pat visit    Past Surgical History:  Procedure Laterality Date  . IR 3D INDEPENDENT WKST  01/12/2018  . IR ANGIO INTRA EXTRACRAN SEL INTERNAL CAROTID BILAT MOD SED  01/12/2018  . IR ANGIO VERTEBRAL SEL VERTEBRAL BILAT MOD SED  01/12/2018  . IR US GUIDE VASC ACCESS RIGHT  01/12/2018    MEDICATIONS: . aspirin EC 81 MG tablet  . clopidogrel (PLAVIX) 75 MG tablet  . ibuprofen (ADVIL,MOTRIN) 200 MG tablet  . methocarbamol (ROBAXIN) 500 MG tablet  . naproxen (NAPROSYN) 500 MG tablet   . Chlorhexidine Gluconate Cloth 2 % PADS 6 each   And  . Chlorhexidine Gluconate Cloth 2 %  PADS 6 each  . Chlorhexidine Gluconate Cloth 2 % PADS 6 each   And  . Chlorhexidine Gluconate Cloth 2 % PADS 6 each    Zannie Cove [facility] Stay [facility]/Anesthesiology Phone [phone] 03/01/2018 3:58 PM

## 2018-03-09 ENCOUNTER — Ambulatory Visit (HOSPITAL_COMMUNITY): Payer: 59

## 2018-03-22 ENCOUNTER — Encounter (HOSPITAL_COMMUNITY): Payer: Self-pay | Admitting: Urology

## 2018-03-22 ENCOUNTER — Other Ambulatory Visit: Payer: Self-pay | Admitting: Radiology

## 2018-03-23 ENCOUNTER — Encounter (HOSPITAL_COMMUNITY)
Admission: RE | Disposition: A | Discharge status: Home / Self Care | Payer: Self-pay | Source: Ambulatory Visit | Attending: Neurosurgery

## 2018-03-23 ENCOUNTER — Inpatient Hospital Stay (HOSPITAL_COMMUNITY)
Admission: RE | Admit: 2018-03-23 | Discharge: 2018-03-24 | DRG: 254 | Disposition: A | Discharge status: Home / Self Care | Payer: 59 | Source: Ambulatory Visit | Attending: Neurosurgery | Admitting: Neurosurgery

## 2018-03-23 ENCOUNTER — Encounter (HOSPITAL_COMMUNITY): Payer: Self-pay | Admitting: *Deleted

## 2018-03-23 ENCOUNTER — Other Ambulatory Visit: Payer: Self-pay

## 2018-03-23 ENCOUNTER — Inpatient Hospital Stay (HOSPITAL_COMMUNITY): Payer: 59 | Admitting: Anesthesiology

## 2018-03-23 ENCOUNTER — Ambulatory Visit (HOSPITAL_COMMUNITY)
Admission: RE | Admit: 2018-03-23 | Discharge: 2018-03-23 | Disposition: A | Discharge status: Home / Self Care | Payer: 59 | Source: Ambulatory Visit | Attending: Neurosurgery | Admitting: Neurosurgery

## 2018-03-23 ENCOUNTER — Inpatient Hospital Stay (HOSPITAL_COMMUNITY): Payer: 59 | Admitting: Physician Assistant

## 2018-03-23 DIAGNOSIS — F17210 Nicotine dependence, cigarettes, uncomplicated: Secondary | ICD-10-CM | POA: Diagnosis present

## 2018-03-23 DIAGNOSIS — Z7982 Long term (current) use of aspirin: Secondary | ICD-10-CM | POA: Diagnosis not present

## 2018-03-23 DIAGNOSIS — I720 Aneurysm of carotid artery: Secondary | ICD-10-CM | POA: Diagnosis present

## 2018-03-23 DIAGNOSIS — Z7902 Long term (current) use of antithrombotics/antiplatelets: Secondary | ICD-10-CM | POA: Diagnosis not present

## 2018-03-23 DIAGNOSIS — Z8249 Family history of ischemic heart disease and other diseases of the circulatory system: Secondary | ICD-10-CM

## 2018-03-23 DIAGNOSIS — I671 Cerebral aneurysm, nonruptured: Secondary | ICD-10-CM

## 2018-03-23 DIAGNOSIS — I10 Essential (primary) hypertension: Secondary | ICD-10-CM | POA: Diagnosis present

## 2018-03-23 HISTORY — PX: RADIOLOGY WITH ANESTHESIA: SHX6223

## 2018-03-23 HISTORY — PX: IR ANGIOGRAM FOLLOW UP STUDY: IMG697

## 2018-03-23 HISTORY — PX: IR TRANSCATH/EMBOLIZ: IMG695

## 2018-03-23 HISTORY — PX: IR ANGIO INTRA EXTRACRAN SEL INTERNAL CAROTID UNI R MOD SED: IMG5362

## 2018-03-23 LAB — CBC WITH DIFFERENTIAL/PLATELET
Abs Immature Granulocytes: 0 10*3/uL (ref 0.0–0.1)
BASOS ABS: 0 10*3/uL (ref 0.0–0.1)
Basophils Relative: 1 %
EOS ABS: 0.1 10*3/uL (ref 0.0–0.7)
EOS PCT: 2 %
HCT: 34.6 % — ABNORMAL LOW (ref 36.0–46.0)
Hemoglobin: 11 g/dL — ABNORMAL LOW (ref 12.0–15.0)
IMMATURE GRANULOCYTES: 0 %
Lymphocytes Relative: 43 %
Lymphs Abs: 2.7 10*3/uL (ref 0.7–4.0)
MCH: 25.6 pg — ABNORMAL LOW (ref 26.0–34.0)
MCHC: 31.8 g/dL (ref 30.0–36.0)
MCV: 80.7 fL (ref 78.0–100.0)
Monocytes Absolute: 0.5 10*3/uL (ref 0.1–1.0)
Monocytes Relative: 8 %
NEUTROS PCT: 46 %
Neutro Abs: 2.9 10*3/uL (ref 1.7–7.7)
PLATELETS: 216 10*3/uL (ref 150–400)
RBC: 4.29 MIL/uL (ref 3.87–5.11)
RDW: 14.4 % (ref 11.5–15.5)
WBC: 6.3 10*3/uL (ref 4.0–10.5)

## 2018-03-23 LAB — ABO/RH: ABO/RH(D): AB POS

## 2018-03-23 LAB — PROTIME-INR
INR: 1.22
PROTHROMBIN TIME: 15.3 s — AB (ref 11.4–15.2)

## 2018-03-23 LAB — MRSA PCR SCREENING: MRSA by PCR: NEGATIVE

## 2018-03-23 LAB — BASIC METABOLIC PANEL
Anion gap: 5 (ref 5–15)
BUN: 7 mg/dL (ref 6–20)
CO2: 21 mmol/L — AB (ref 22–32)
CREATININE: 0.92 mg/dL (ref 0.44–1.00)
Calcium: 8.8 mg/dL — ABNORMAL LOW (ref 8.9–10.3)
Chloride: 111 mmol/L (ref 98–111)
GFR calc Af Amer: >60 mL/min (ref >60)
GFR calc non Af Amer: >60 mL/min (ref >60)
GLUCOSE: 113 mg/dL — AB (ref 70–99)
Potassium: 3.9 mmol/L (ref 3.5–5.1)
Sodium: 137 mmol/L (ref 135–145)

## 2018-03-23 LAB — APTT: APTT: 29 s (ref 24–36)

## 2018-03-23 LAB — TYPE AND SCREEN
ABO/RH(D): AB POS
Antibody Screen: NEGATIVE

## 2018-03-23 LAB — POCT PREGNANCY, URINE: Preg Test, Ur: NEGATIVE

## 2018-03-23 SURGERY — IR WITH ANESTHESIA
Anesthesia: General

## 2018-03-23 MED ORDER — SODIUM CHLORIDE 0.9 % IV SOLN
INTRAVENOUS | Status: DC
  Administered 2018-03-23 – 2018-03-24 (×2): via INTRAVENOUS

## 2018-03-23 MED ORDER — EPTIFIBATIDE 20 MG/10ML IV SOLN
INTRAVENOUS | Status: DC | PRN
  Administered 2018-03-23: 16 mg

## 2018-03-23 MED ORDER — SODIUM CHLORIDE 0.9 % IV SOLN
INTRAVENOUS | Status: DC | PRN
  Administered 2018-03-23: 10 ug/min via INTRAVENOUS

## 2018-03-23 MED ORDER — LABETALOL HCL 5 MG/ML IV SOLN: 10.0000 mg | INTRAVENOUS | Status: DC | PRN

## 2018-03-23 MED ORDER — VERAPAMIL HCL 2.5 MG/ML IV SOLN
INTRAVENOUS | Status: DC | PRN
  Administered 2018-03-23: 10 mg via INTRA_ARTERIAL

## 2018-03-23 MED ORDER — FENTANYL CITRATE (PF) 100 MCG/2ML IJ SOLN: 25.0000 ug | INTRAMUSCULAR | Status: DC | PRN

## 2018-03-23 MED ORDER — HEPARIN SODIUM (PORCINE) 1000 UNIT/ML IJ SOLN
INTRAMUSCULAR | Status: DC | PRN
  Administered 2018-03-23: 5000 [IU] via INTRAVENOUS
  Administered 2018-03-23: 1000 [IU] via INTRAVENOUS

## 2018-03-23 MED ORDER — EPTIFIBATIDE 20 MG/10ML IV SOLN
INTRAVENOUS | Status: AC
  Filled 2018-03-23: qty 10

## 2018-03-23 MED ORDER — LABETALOL HCL 5 MG/ML IV SOLN
INTRAVENOUS | Status: DC | PRN
  Administered 2018-03-23 (×2): 5 mg via INTRAVENOUS

## 2018-03-23 MED ORDER — ONDANSETRON HCL 4 MG/2ML IJ SOLN
INTRAMUSCULAR | Status: DC | PRN
  Administered 2018-03-23: 4 mg via INTRAVENOUS

## 2018-03-23 MED ORDER — ASPIRIN EC 325 MG PO TBEC
325.0000 mg | DELAYED_RELEASE_TABLET | Freq: Every day | ORAL | Status: DC
  Administered 2018-03-23 – 2018-03-24 (×2): 325 mg via ORAL
  Filled 2018-03-23 (×2): qty 1

## 2018-03-23 MED ORDER — FENTANYL CITRATE (PF) 100 MCG/2ML IJ SOLN
INTRAMUSCULAR | Status: DC | PRN
  Administered 2018-03-23: 100 ug via INTRAVENOUS

## 2018-03-23 MED ORDER — ONDANSETRON HCL 4 MG PO TABS: 4.0000 mg | ORAL_TABLET | ORAL | Status: DC | PRN

## 2018-03-23 MED ORDER — LISINOPRIL 40 MG PO TABS
40.0000 mg | ORAL_TABLET | Freq: Every day | ORAL | Status: DC
  Administered 2018-03-23 – 2018-03-24 (×2): 40 mg via ORAL
  Filled 2018-03-23: qty 1
  Filled 2018-03-23: qty 2
  Filled 2018-03-23: qty 1

## 2018-03-23 MED ORDER — IOPAMIDOL (ISOVUE-300) INJECTION 61%
INTRAVENOUS | Status: AC
  Administered 2018-03-23: 50 mL
  Filled 2018-03-23: qty 200

## 2018-03-23 MED ORDER — DEXAMETHASONE SODIUM PHOSPHATE 10 MG/ML IJ SOLN
INTRAMUSCULAR | Status: DC | PRN
  Administered 2018-03-23: 10 mg via INTRAVENOUS

## 2018-03-23 MED ORDER — HYDROCODONE-ACETAMINOPHEN 5-325 MG PO TABS
1.0000 | ORAL_TABLET | ORAL | Status: DC | PRN
  Administered 2018-03-23: 1 via ORAL
  Filled 2018-03-23: qty 1

## 2018-03-23 MED ORDER — LACTATED RINGERS IV SOLN
INTRAVENOUS | Status: DC | PRN
  Administered 2018-03-23: 11:00:00 via INTRAVENOUS

## 2018-03-23 MED ORDER — CLOPIDOGREL BISULFATE 75 MG PO TABS
75.0000 mg | ORAL_TABLET | Freq: Every day | ORAL | Status: DC
  Administered 2018-03-24: 75 mg via ORAL
  Filled 2018-03-23: qty 1

## 2018-03-23 MED ORDER — CEFAZOLIN SODIUM-DEXTROSE 2-3 GM-%(50ML) IV SOLR
INTRAVENOUS | Status: DC | PRN
  Administered 2018-03-23: 2 g via INTRAVENOUS

## 2018-03-23 MED ORDER — MIDAZOLAM HCL 5 MG/5ML IJ SOLN
INTRAMUSCULAR | Status: DC | PRN
  Administered 2018-03-23 (×2): 2 mg via INTRAVENOUS

## 2018-03-23 MED ORDER — ONDANSETRON HCL 4 MG/2ML IJ SOLN: 4.0000 mg | INTRAMUSCULAR | Status: DC | PRN

## 2018-03-23 MED ORDER — PROPOFOL 10 MG/ML IV BOLUS
INTRAVENOUS | Status: DC | PRN
  Administered 2018-03-23: 200 mg via INTRAVENOUS
  Administered 2018-03-23: 40 mg via INTRAVENOUS
  Administered 2018-03-23: 50 mg via INTRAVENOUS

## 2018-03-23 MED ORDER — ONDANSETRON HCL 4 MG/2ML IJ SOLN: 4.0000 mg | Freq: Once | INTRAMUSCULAR | Status: DC | PRN

## 2018-03-23 MED ORDER — CEFAZOLIN SODIUM-DEXTROSE 2-4 GM/100ML-% IV SOLN
2.0000 g | INTRAVENOUS | Status: DC
  Filled 2018-03-23: qty 100

## 2018-03-23 MED ORDER — LIDOCAINE 2% (20 MG/ML) 5 ML SYRINGE
INTRAMUSCULAR | Status: DC | PRN
  Administered 2018-03-23: 80 mg via INTRAVENOUS

## 2018-03-23 MED ORDER — VARENICLINE TARTRATE 1 MG PO TABS
1.0000 mg | ORAL_TABLET | Freq: Two times a day (BID) | ORAL | Status: DC
  Administered 2018-03-23 – 2018-03-24 (×3): 1 mg via ORAL
  Filled 2018-03-23 (×3): qty 1

## 2018-03-23 MED ORDER — PHENYLEPHRINE 40 MCG/ML (10ML) SYRINGE FOR IV PUSH (FOR BLOOD PRESSURE SUPPORT)
PREFILLED_SYRINGE | INTRAVENOUS | Status: DC | PRN
  Administered 2018-03-23 (×2): 40 ug via INTRAVENOUS

## 2018-03-23 MED ORDER — NEOSTIGMINE METHYLSULFATE 3 MG/3ML IV SOSY
PREFILLED_SYRINGE | INTRAVENOUS | Status: DC | PRN
  Administered 2018-03-23: 4 mg via INTRAVENOUS

## 2018-03-23 MED ORDER — VERAPAMIL HCL 2.5 MG/ML IV SOLN
INTRAVENOUS | Status: AC
  Filled 2018-03-23: qty 4

## 2018-03-23 MED ORDER — LACTATED RINGERS IV SOLN
INTRAVENOUS | Status: DC
  Administered 2018-03-23: 08:00:00 via INTRAVENOUS

## 2018-03-23 MED ORDER — ROCURONIUM BROMIDE 10 MG/ML (PF) SYRINGE
PREFILLED_SYRINGE | INTRAVENOUS | Status: DC | PRN
  Administered 2018-03-23: 50 mg via INTRAVENOUS
  Administered 2018-03-23: 30 mg via INTRAVENOUS

## 2018-03-23 MED ORDER — GLYCOPYRROLATE PF 0.2 MG/ML IJ SOSY
PREFILLED_SYRINGE | INTRAMUSCULAR | Status: DC | PRN
  Administered 2018-03-23: 0.4 mg via INTRAVENOUS

## 2018-03-23 NOTE — Anesthesia Procedure Notes (Signed)
Procedure Name: Intubation Date/Time: 03/23/2018 10:29 AM Performed by: Lovie Chol, CRNA Pre-anesthesia Checklist: Patient identified, Emergency Drugs available, Suction available and Patient being monitored Patient Re-evaluated:Patient Re-evaluated prior to induction Oxygen Delivery Method: Circle System Utilized Preoxygenation: Pre-oxygenation with 100% oxygen Induction Type: IV induction Ventilation: Mask ventilation without difficulty Laryngoscope Size: Miller and 2 Grade View: Grade I Tube type: Oral Tube size: 7.5 mm Number of attempts: 1 Airway Equipment and Method: Stylet and Oral airway Placement Confirmation: ETT inserted through vocal cords under direct vision,  positive ETCO2 and breath sounds checked- equal and bilateral Secured at: 21 cm Tube secured with: Tape Dental Injury: Teeth and Oropharynx as per pre-operative assessment

## 2018-03-23 NOTE — Anesthesia Procedure Notes (Signed)
Arterial Line Insertion Start/End10/06/2017 9:15 AM, 03/23/2018 9:25 AM Performed by: Leonides Grills, MD, anesthesiologist  Patient location: Pre-op. Preanesthetic checklist: patient identified, IV checked, site marked, risks and benefits discussed, surgical consent, monitors and equipment checked, pre-op evaluation, timeout performed and anesthesia consent Lidocaine 1% used for infiltration Left, radial was placed Catheter size: 20 Fr Hand hygiene performed , maximum sterile barriers used  and Seldinger technique used  Attempts: 1 Procedure performed using ultrasound guided technique. Following insertion, dressing applied and Biopatch. Post procedure assessment: normal and unchanged

## 2018-03-23 NOTE — Progress Notes (Addendum)
Patient's groin site is bleeding.  RN holding pressure.  Neurosurgery paged.  Vpad added to groin site.  Groin site stop bleeding at 1630.  Groin site redress.  RN will continue to monitor.

## 2018-03-23 NOTE — Progress Notes (Signed)
Orthopedic Tech Progress Note Patient Details:  Breanna Pope 15-Feb-1986 [id number]  Ortho Devices Ortho Device/Splint Location: 10lb sand bag       Saul Fordyce 03/23/2018, 4:57 PM

## 2018-03-23 NOTE — Progress Notes (Signed)
RN noticed patient's groin site with a hematoma during assessment.  IR called and nurse from IR came to assess pt and agree that site has hematoma.  Hematoma marked. Neurosurgery called, RN waiting for call back.  Will continue to monitor.

## 2018-03-23 NOTE — Transfer of Care (Signed)
Immediate Anesthesia Transfer of Care Note  Patient: Breanna Pope  Procedure(s) Performed: Surpass embolization of aneurysm (N/A )  Patient Location: PACU  Anesthesia Type:General  Level of Consciousness: awake, oriented and patient cooperative  Airway & Oxygen Therapy: Patient Spontanous Breathing and Patient connected to nasal cannula oxygen  Post-op Assessment: Report given to RN and Post -op Vital signs reviewed and stable  Post vital signs: stable  Last Vitals:  Vitals Value Taken Time  BP 124/74 03/23/2018  1:40 PM  Temp    Pulse 86 03/23/2018  1:47 PM  Resp 12 03/23/2018  1:47 PM  SpO2 93 % 03/23/2018  1:47 PM  Vitals shown include unvalidated device data.  Last Pain:  Vitals:   03/23/18 1340  PainSc: (P) 0-No pain         Complications: No apparent anesthesia complications

## 2018-03-23 NOTE — Sedation Documentation (Signed)
This RN called Pacu to give report. Patient going to room 3. Patient still under care of CRNA
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
## 2018-03-23 NOTE — Brief Op Note (Signed)
  NEUROSURGERY BRIEF OPERATIVE NOTE   PREOP DX: Cerebral aneurysm  POSTOP DX: Same  PROCEDURE: Surpass embolization RICA aneurysm  SURGEON: Dr. Lisbeth Renshaw, MD  ANESTHESIA: GETA  EBL: Minimal  SPECIMENS: None  COMPLICATIONS: None  CONDITION: Stable to recovery  FINDINGS: 1. Successful embolization of RICA aneurysm with surpass device. Sluggish flow through the right ophthalmic treated with 16mg  IA Integrilin. Good retinal perfusion was maintained.

## 2018-03-23 NOTE — Sedation Documentation (Signed)
Right groin sheath pulled, 32fr exoseal closure deviced used. Manual pressure being held at right groin site.

## 2018-03-23 NOTE — Anesthesia Preprocedure Evaluation (Signed)
Anesthesia Evaluation  Patient identified by MRN, date of birth, ID band Patient awake    Reviewed: Allergy & Precautions, NPO status , Patient's Chart, lab work & pertinent test results  Airway Mallampati: II  TM Distance: >3 FB Neck ROM: Full    Dental no notable dental hx.    Pulmonary Current Smoker,    Pulmonary exam normal breath sounds clear to auscultation       Cardiovascular hypertension, Pt. on medications Normal cardiovascular exam Rhythm:Regular Rate:Normal  ECG: ST, PAC's, rate 101   Neuro/Psych  Headaches, Anxiety    GI/Hepatic negative GI ROS, Neg liver ROS,   Endo/Other  negative endocrine ROS  Renal/GU negative Renal ROS     Musculoskeletal negative musculoskeletal ROS (+)   Abdominal (+) + obese,   Peds  Hematology  (+) anemia ,   Anesthesia Other Findings non ruptured cerebral aneurysm   Reproductive/Obstetrics hcg negative                             Anesthesia Physical Anesthesia Plan  ASA: II  Anesthesia Plan: General   Post-op Pain Management:    Induction: Intravenous  PONV Risk Score and Plan: 2 and Ondansetron, Dexamethasone and Treatment may vary due to age or medical condition  Airway Management Planned: Oral ETT  Additional Equipment: Arterial line  Intra-op Plan:   Post-operative Plan: Extubation in OR  Informed Consent: I have reviewed the patients History and Physical, chart, labs and discussed the procedure including the risks, benefits and alternatives for the proposed anesthesia with the patient or authorized representative who has indicated his/her understanding and acceptance.   Dental advisory given  Plan Discussed with: CRNA  Anesthesia Plan Comments:         Anesthesia Quick Evaluation

## 2018-03-23 NOTE — Progress Notes (Signed)
Per Dr. Conchita Paris repeat labs.

## 2018-03-24 ENCOUNTER — Encounter (HOSPITAL_COMMUNITY): Payer: Self-pay | Admitting: Neurosurgery

## 2018-03-24 ENCOUNTER — Ambulatory Visit: Payer: 59 | Admitting: Family Medicine

## 2018-03-24 MED ORDER — HYDROCODONE-ACETAMINOPHEN 5-325 MG PO TABS: 1.0000 | ORAL_TABLET | ORAL | 0 refills | Status: DC | PRN

## 2018-03-24 NOTE — Discharge Summary (Signed)
Physician Discharge Summary  Patient ID: QUINTELLA MURA MRN: [id number] DOB/AGE: 09/26/1985 32 y.o.  Admit date: 03/23/2018 Discharge date: 03/24/2018  Admission Diagnoses:  Nonruptured cerebral aneurysm   Discharge Diagnoses:  Same Active Problems:   Cerebral aneurysm, nonruptured   Discharged Condition: Stable  Hospital Course:  GARIELLE MROZ is a 32 y.o. female who was admitted for the below procedure. There were no post operative complications. At time of discharge, pain was well controlled, ambulating with Pt/OT, tolerating po, voiding normal. Ready for discharge.  Treatments: Surgery - Surpass embolization RICA aneurysm  Discharge Exam: Blood pressure 120/74, pulse 61, temperature 98.1 F (36.7 C), temperature source Oral, resp. rate 14, height 5' 3.5" (1.613 m), weight 92.1 kg, last menstrual period 03/01/2018, SpO2 99 %. Awake, alert, oriented Speech fluent, appropriate CN grossly intact 5/5 BUE/BLE Wound c/d/i  Disposition: Discharge disposition: 01-Home or Self Care       Discharge Instructions    Call MD for:  difficulty breathing, headache or visual disturbances   Complete by:  As directed    Call MD for:  persistant dizziness or light-headedness   Complete by:  As directed    Call MD for:  redness, tenderness, or signs of infection (pain, swelling, redness, odor or green/yellow discharge around incision site)   Complete by:  As directed    Call MD for:  severe uncontrolled pain   Complete by:  As directed    Call MD for:  temperature >100.4   Complete by:  As directed    Diet general   Complete by:  As directed    Driving Restrictions   Complete by:  As directed    Do not drive until given clearance.   Increase activity slowly   Complete by:  As directed    Lifting restrictions   Complete by:  As directed    Do not lift anything >10lbs. Avoid bending and twisting in awkward positions. Avoid bending at the back.   May shower / Bathe    Complete by:  As directed    In 24 hours. Okay to wash wound with warm soapy water. Avoid scrubbing the wound. Pat dry.   Remove dressing in 24 hours   Complete by:  As directed      Allergies as of 03/24/2018   No Known Allergies     Medication List    TAKE these medications   aspirin EC 81 MG tablet Take 81 mg by mouth daily.   clopidogrel 75 MG tablet Commonly known as:  PLAVIX Take 75 mg by mouth daily.   HYDROcodone-acetaminophen 5-325 MG tablet Commonly known as:  NORCO/VICODIN Take 1 tablet by mouth every 4 (four) hours as needed for moderate pain.   ibuprofen 200 MG tablet Commonly known as:  ADVIL,MOTRIN Take 600-800 mg by mouth every 6 (six) hours as needed (for pain/headaches).   lisinopril 40 MG tablet Commonly known as:  PRINIVIL,ZESTRIL Take 40 mg by mouth daily.   methocarbamol 500 MG tablet Commonly known as:  ROBAXIN Take 1 tablet (500 mg total) by mouth 2 (two) times daily.   naproxen 500 MG tablet Commonly known as:  NAPROSYN Take 1 tablet (500 mg total) by mouth 2 (two) times daily with a meal.   varenicline 1 MG tablet Commonly known as:  CHANTIX Take 1 mg by mouth 2 (two) times daily.      Follow-up Information    Lisbeth Renshaw, MD Follow up.   Specialty:  Neurosurgery Contact information:  [address] [place] [address] [phone]           Signed: Alyson Ingles 03/24/2018, 8:45 AM

## 2018-03-24 NOTE — Progress Notes (Signed)
Discharge summary review with patient.  VSS.  Patient discharge with family.

## 2018-03-24 NOTE — Anesthesia Postprocedure Evaluation (Signed)
Anesthesia Post Note  Patient: Breanna Pope  Procedure(s) Performed: Surpass embolization of aneurysm (N/A )     Patient location during evaluation: PACU Anesthesia Type: General Level of consciousness: awake Pain management: pain level controlled Vital Signs Assessment: post-procedure vital signs reviewed and stable Respiratory status: spontaneous breathing, nonlabored ventilation, respiratory function stable and patient connected to nasal cannula oxygen Cardiovascular status: blood pressure returned to baseline and stable Postop Assessment: no apparent nausea or vomiting Anesthetic complications: no    Last Vitals:  Vitals:   03/24/18 1000 03/24/18 1030  BP: 131/75   Pulse: 71 83  Resp: 19 (!) 31  Temp:    SpO2: 100% 98%    Last Pain:  Vitals:   03/24/18 0800  TempSrc: Oral  PainSc: 0-No pain                 Ryan P Ellender

## 2018-09-01 ENCOUNTER — Other Ambulatory Visit (HOSPITAL_COMMUNITY): Payer: Self-pay | Admitting: Neurosurgery

## 2018-09-01 DIAGNOSIS — I671 Cerebral aneurysm, nonruptured: Secondary | ICD-10-CM

## 2018-09-07 ENCOUNTER — Other Ambulatory Visit: Payer: Self-pay | Admitting: Neurosurgery

## 2018-10-08 ENCOUNTER — Ambulatory Visit (HOSPITAL_COMMUNITY): Admission: RE | Admit: 2018-10-08 | Payer: 59 | Source: Ambulatory Visit

## 2018-12-03 ENCOUNTER — Ambulatory Visit (HOSPITAL_COMMUNITY): Admission: RE | Admit: 2018-12-03 | Payer: 59 | Source: Ambulatory Visit

## 2018-12-15 ENCOUNTER — Other Ambulatory Visit: Payer: Self-pay | Admitting: Neurosurgery

## 2018-12-15 NOTE — H&P (Signed)
Chief Complaint   Aneurysm   HPI   HPI: Breanna Pope is a 33 y.o. female approximately 6 months s/p surpass embolization of right internal carotid artery aneurysm.  She has done very well and is essentially back to baseline.  She does occasionally get some headaches which are improved with over-the-counter Tylenol.  She does continue to take aspirin and Plavix on a daily basis. She presents today for routine diagnostic cerebral angiogram. She is without any concerns.   Patient Active Problem List   Diagnosis Date Noted  . Cerebral aneurysm, nonruptured 03/23/2018  . Tooth abscess 05/16/2013    PMH: Past Medical History:  Diagnosis Date  . Anxiety   . Headache   . Hypertension    no meds ..elevated at pat visit    PSH: Past Surgical History:  Procedure Laterality Date  . IR 3D INDEPENDENT WKST  01/12/2018  . IR ANGIO INTRA EXTRACRAN SEL INTERNAL CAROTID BILAT MOD SED  01/12/2018  . IR ANGIO INTRA EXTRACRAN SEL INTERNAL CAROTID UNI R MOD SED  03/23/2018  . IR ANGIO VERTEBRAL SEL VERTEBRAL BILAT MOD SED  01/12/2018  . IR ANGIOGRAM FOLLOW UP STUDY  03/23/2018  . IR TRANSCATH/EMBOLIZ  03/23/2018  . IR US GUIDE VASC ACCESS RIGHT  01/12/2018  . RADIOLOGY WITH ANESTHESIA N/A 03/23/2018   Procedure: Surpass embolization of aneurysm;  Surgeon: Lisbeth RenshawNundkumar, Neelesh, MD;  Location: [facility] OR;  Service: Radiology;  Laterality: N/A;    (Not in a hospital admission)   SH: Social History   Tobacco Use  . Smoking status: Current Every Day Smoker    Types: Cigarettes    Last attempt to quit: 11/21/2013    Years since quitting: 5.0  . Smokeless tobacco: Never Used  Substance Use Topics  . Alcohol use: No  . Drug use: No    MEDS: Prior to Admission medications   Medication Sig Start Date End Date Taking? Authorizing Provider  acetaminophen (TYLENOL) 500 MG tablet Take 1,000 mg by mouth every 6 (six) hours as needed for moderate pain or headache.   Yes [provider]   aspirin EC 325 MG tablet Take 325 mg by mouth daily.    Yes [provider]  clopidogrel (PLAVIX) 75 MG tablet Take 75 mg by mouth daily. 01/29/18  Yes [provider]  Multiple Vitamins-Minerals (MULTIVITAMIN WITH MINERALS) tablet Take 1 tablet by mouth daily.   Yes [provider]    ALLERGY: No Known Allergies  Social History   Tobacco Use  . Smoking status: Current Every Day Smoker    Types: Cigarettes    Last attempt to quit: 11/21/2013    Years since quitting: 5.0  . Smokeless tobacco: Never Used  Substance Use Topics  . Alcohol use: No     Family History  Problem Relation Age of Onset  . Hypertension Mother   . Hypertension Other   . Diabetes Other      ROS   ROS  Exam   There were no vitals filed for this visit. General appearance: WDWN, NAD Eyes: No scleral injection Cardiovascular: Regular rate and rhythm without murmurs, rubs, gallops. No edema or variciosities. Distal pulses normal. Pulmonary: Effort normal, non-labored breathing Musculoskeletal:     Muscle tone upper extremities: Normal    Muscle tone lower extremities: Normal    Motor exam: Upper Extremities Deltoid Bicep Tricep Grip  Right 5/5 5/5 5/5 5/5  Left 5/5 5/5 5/5 5/5   Lower Extremity IP Quad PF DF  EHL  Right 5/5 5/5 5/5 5/5 5/5  Left 5/5 5/5 5/5 5/5 5/5   Neurological Mental Status:    - Patient is awake, alert, oriented to person, place, month, year, and situation    - Patient is able to give a clear and coherent history.    - No signs of aphasia or neglect Cranial Nerves    - II: Visual Fields are full. PERRL    - III/IV/VI: EOMI without ptosis or diploplia.     - V: Facial sensation is grossly normal    - VII: Facial movement is symmetric.     - VIII: hearing is intact to voice    - X: Uvula elevates symmetrically    - XI: Shoulder shrug is symmetric.    - XII: tongue is midline without atrophy or fasciculations.  Sensory: Sensation grossly intact to  LT  Results - Imaging/Labs   No results found for this or any previous visit (from the past 48 hour(s)).  No results found.  IMAGING: No new imaging  Impression/Plan    33 y.o. female approx 6 months status post surpass embolization of a right internal carotid artery aneurysm. She presents today for routine follow-up diagnostic cerebral angiogram.     While in the office risks, benefits and alternatives to the procedure were discussed.  Patient stated understanding and wished to proceed.  Ferne Reus, PA-C [facility] [employer]

## 2018-12-17 ENCOUNTER — Encounter (HOSPITAL_COMMUNITY): Payer: Self-pay

## 2018-12-17 ENCOUNTER — Other Ambulatory Visit (HOSPITAL_COMMUNITY): Payer: Self-pay | Admitting: Neurosurgery

## 2018-12-17 ENCOUNTER — Ambulatory Visit (HOSPITAL_COMMUNITY)
Admission: RE | Admit: 2018-12-17 | Discharge: 2018-12-17 | Disposition: A | Discharge status: Home / Self Care | Payer: 59 | Source: Ambulatory Visit | Attending: Neurosurgery | Admitting: Neurosurgery

## 2018-12-17 ENCOUNTER — Other Ambulatory Visit: Payer: Self-pay

## 2018-12-17 DIAGNOSIS — I671 Cerebral aneurysm, nonruptured: Secondary | ICD-10-CM

## 2018-12-17 DIAGNOSIS — Z7902 Long term (current) use of antithrombotics/antiplatelets: Secondary | ICD-10-CM | POA: Insufficient documentation

## 2018-12-17 DIAGNOSIS — F419 Anxiety disorder, unspecified: Secondary | ICD-10-CM | POA: Diagnosis not present

## 2018-12-17 DIAGNOSIS — I10 Essential (primary) hypertension: Secondary | ICD-10-CM | POA: Diagnosis not present

## 2018-12-17 DIAGNOSIS — F17210 Nicotine dependence, cigarettes, uncomplicated: Secondary | ICD-10-CM | POA: Diagnosis not present

## 2018-12-17 DIAGNOSIS — Z7982 Long term (current) use of aspirin: Secondary | ICD-10-CM | POA: Diagnosis not present

## 2018-12-17 HISTORY — PX: IR ANGIO INTRA EXTRACRAN SEL INTERNAL CAROTID BILAT MOD SED: IMG5363

## 2018-12-17 HISTORY — PX: IR ANGIO VERTEBRAL SEL VERTEBRAL BILAT MOD SED: IMG5369

## 2018-12-17 HISTORY — PX: IR US GUIDE VASC ACCESS RIGHT: IMG2390

## 2018-12-17 LAB — CBC
HCT: 38.6 % (ref 36.0–46.0)
Hemoglobin: 11.9 g/dL — ABNORMAL LOW (ref 12.0–15.0)
MCH: 24.6 pg — ABNORMAL LOW (ref 26.0–34.0)
MCHC: 30.8 g/dL (ref 30.0–36.0)
MCV: 79.8 fL — ABNORMAL LOW (ref 80.0–100.0)
Platelets: 257 10*3/uL (ref 150–400)
RBC: 4.84 MIL/uL (ref 3.87–5.11)
RDW: 15.9 % — ABNORMAL HIGH (ref 11.5–15.5)
WBC: 6.9 10*3/uL (ref 4.0–10.5)
nRBC: 0 % (ref 0.0–0.2)

## 2018-12-17 LAB — BASIC METABOLIC PANEL
Anion gap: 9 (ref 5–15)
BUN: 12 mg/dL (ref 6–20)
CO2: 23 mmol/L (ref 22–32)
Calcium: 9.2 mg/dL (ref 8.9–10.3)
Chloride: 105 mmol/L (ref 98–111)
Creatinine, Ser: 1 mg/dL (ref 0.44–1.00)
GFR calc Af Amer: >60 mL/min (ref >60)
GFR calc non Af Amer: >60 mL/min (ref >60)
Glucose, Bld: 112 mg/dL — ABNORMAL HIGH (ref 70–99)
Potassium: 3.5 mmol/L (ref 3.5–5.1)
Sodium: 137 mmol/L (ref 135–145)

## 2018-12-17 LAB — PROTIME-INR
INR: 1.2 (ref 0.8–1.2)
Prothrombin Time: 14.9 seconds (ref 11.4–15.2)

## 2018-12-17 LAB — PREGNANCY, URINE: Preg Test, Ur: NEGATIVE

## 2018-12-17 MED ORDER — HEPARIN SODIUM (PORCINE) 1000 UNIT/ML IJ SOLN
INTRAMUSCULAR | Status: AC | PRN
  Administered 2018-12-17: 3000 [IU] via INTRAVENOUS

## 2018-12-17 MED ORDER — HYDRALAZINE HCL 20 MG/ML IJ SOLN
INTRAMUSCULAR | Status: AC | PRN
  Administered 2018-12-17 (×2): 2 mg via INTRAVENOUS

## 2018-12-17 MED ORDER — VERAPAMIL HCL 2.5 MG/ML IV SOLN
INTRAVENOUS | Status: AC | PRN
  Administered 2018-12-17: 5 mg via INTRA_ARTERIAL

## 2018-12-17 MED ORDER — NITROGLYCERIN 1 MG/10 ML FOR IR/CATH LAB
INTRA_ARTERIAL | Status: AC
  Filled 2018-12-17: qty 10

## 2018-12-17 MED ORDER — MIDAZOLAM HCL 2 MG/2ML IJ SOLN
INTRAMUSCULAR | Status: AC | PRN
  Administered 2018-12-17: 1 mg via INTRAVENOUS

## 2018-12-17 MED ORDER — FENTANYL CITRATE (PF) 100 MCG/2ML IJ SOLN
INTRAMUSCULAR | Status: AC | PRN
  Administered 2018-12-17 (×2): 25 ug via INTRAVENOUS

## 2018-12-17 MED ORDER — LIDOCAINE HCL 1 % IJ SOLN
INTRAMUSCULAR | Status: AC
  Filled 2018-12-17: qty 20

## 2018-12-17 MED ORDER — LIDOCAINE HCL 1 % IJ SOLN
INTRAMUSCULAR | Status: AC | PRN
  Administered 2018-12-17: 1 mL

## 2018-12-17 MED ORDER — SODIUM CHLORIDE 0.9 % IV SOLN: INTRAVENOUS | Status: DC

## 2018-12-17 MED ORDER — MIDAZOLAM HCL 2 MG/2ML IJ SOLN
INTRAMUSCULAR | Status: AC
  Filled 2018-12-17: qty 2

## 2018-12-17 MED ORDER — VERAPAMIL HCL 2.5 MG/ML IV SOLN
INTRAVENOUS | Status: AC
  Filled 2018-12-17: qty 2

## 2018-12-17 MED ORDER — HYDROCODONE-ACETAMINOPHEN 5-325 MG PO TABS: 1.0000 | ORAL_TABLET | ORAL | Status: DC | PRN

## 2018-12-17 MED ORDER — HEPARIN SODIUM (PORCINE) 1000 UNIT/ML IJ SOLN
INTRAMUSCULAR | Status: AC
  Filled 2018-12-17: qty 1

## 2018-12-17 MED ORDER — HYDRALAZINE HCL 20 MG/ML IJ SOLN
INTRAMUSCULAR | Status: AC
  Filled 2018-12-17: qty 1

## 2018-12-17 MED ORDER — VERAPAMIL HCL 2.5 MG/ML IV SOLN
INTRA_ARTERIAL | Status: AC | PRN
  Administered 2018-12-17 (×2): via INTRA_ARTERIAL

## 2018-12-17 MED ORDER — FENTANYL CITRATE (PF) 100 MCG/2ML IJ SOLN
INTRAMUSCULAR | Status: AC
  Filled 2018-12-17: qty 2

## 2018-12-17 MED ORDER — NITROGLYCERIN 1 MG/10 ML FOR IR/CATH LAB
INTRA_ARTERIAL | Status: AC | PRN
  Administered 2018-12-17: 200 ug via INTRA_ARTERIAL

## 2018-12-17 NOTE — Discharge Instructions (Addendum)
Drink plenty of fluids over next 48 hours and keep right wrist elevated at heart level for 24 hours  Radial Site Care  This sheet gives you information about how to care for yourself after your procedure. Your health care provider may also give you more specific instructions. If you have problems or questions, contact your health care provider. What can I expect after the procedure? After the procedure, it is common to have:  Bruising and tenderness at the catheter insertion area. Follow these instructions at home: Medicines  Take over-the-counter and prescription medicines only as told by your health care provider. Insertion site care  Follow instructions from your health care provider about how to take care of your insertion site. Make sure you: ? Wash your hands with soap and water before you change your bandage (dressing). If soap and water are not available, use hand sanitizer. ? Remove your dressing as told by your health care provider. In 24-48 hours ? Leave stitches (sutures), skin glue, or adhesive strips in place. These skin closures may need to stay in place for 2 weeks or longer. If adhesive strip edges start to loosen and curl up, you may trim the loose edges. Do not remove adhesive strips completely unless your health care provider tells you to do that.  Check your insertion site every day for signs of infection. Check for: ? Redness, swelling, or pain. ? Fluid or blood. ? Pus or a bad smell. ? Warmth.  Do not take baths, swim, or use a hot tub until your health care provider approves.  You may shower 24-48 hours after the procedure, or as directed by your health care provider. ? Remove the dressing and gently wash the site with plain soap and water. ? Pat the area dry with a clean towel. ? Do not rub the site. That could cause bleeding.  Do not apply powder or lotion to the site. Activity   For 24 hours after the procedure, or as directed by your health care  provider: ? Do not flex or bend the affected arm. ? Do not push or pull heavy objects with the affected arm. ? Do not drive yourself home from the hospital or clinic. You may drive 24 hours after the procedure unless your health care provider tells you not to. ? Do not operate machinery or power tools.  Do not lift anything that is heavier than 10 lb (4.5 kg), or the limit that you are told, until your health care provider says that it is safe. For 5 days  Ask your health care provider when it is okay to: ? Return to work or school. ? Resume usual physical activities or sports. ? Resume sexual activity. General instructions  If the catheter site starts to bleed, raise your arm and put firm pressure on the site. If the bleeding does not stop, get help right away. This is a medical emergency.  If you went home on the same day as your procedure, a responsible adult should be with you for the first 24 hours after you arrive home.  Keep all follow-up visits as told by your health care provider. This is important. Contact a health care provider if:  You have a fever.  You have redness, swelling, or yellow drainage around your insertion site. Get help right away if:  You have unusual pain at the radial site.  The catheter insertion area swells very fast.  The insertion area is bleeding, and the bleeding does not stop  when you hold steady pressure on the area.  Your arm or hand becomes pale, cool, tingly, or numb. These symptoms may represent a serious problem that is an emergency. Do not wait to see if the symptoms will go away. Get medical help right away. Call your local emergency services (911 in the [place] Do not drive yourself to the hospital. Summary  After the procedure, it is common to have bruising and tenderness at the site.  Follow instructions from your health care provider about how to take care of your radial site wound. Check the wound every day for signs of  infection.  Do not lift anything that is heavier than 10 lb (4.5 kg), or the limit that you are told, until your health care provider says that it is safe. This information is not intended to replace advice given to you by your health care provider. Make sure you discuss any questions you have with your health care provider. Document Released: 07/12/2010 Document Revised: 07/15/2017 Document Reviewed: 07/15/2017 Elsevier Interactive Patient Education  2019 [employer] Moderate Conscious Sedation, Adult, Care After These instructions provide you with information about caring for yourself after your procedure. Your health care provider may also give you more specific instructions. Your treatment has been planned according to current medical practices, but problems sometimes occur. Call your health care provider if you have any problems or questions after your procedure. What can I expect after the procedure? After your procedure, it is common:  To feel sleepy for several hours.  To feel clumsy and have poor balance for several hours.  To have poor judgment for several hours.  To vomit if you eat too soon. Follow these instructions at home: For at least 24 hours after the procedure:   Do not: ? Participate in activities where you could fall or become injured. ? Drive. ? Use heavy machinery. ? Drink alcohol. ? Take sleeping pills or medicines that cause drowsiness. ? Make important decisions or sign legal documents. ? Take care of children on your own.  Rest. Eating and drinking  Follow the diet recommended by your health care provider.  If you vomit: ? Drink water, juice, or soup when you can drink without vomiting. ? Make sure you have little or no nausea before eating solid foods. General instructions  Have a responsible adult stay with you until you are awake and alert.  Take over-the-counter and prescription medicines only as told by your health care provider.  If you  smoke, do not smoke without supervision.  Keep all follow-up visits as told by your health care provider. This is important. Contact a health care provider if:  You keep feeling nauseous or you keep vomiting.  You feel light-headed.  You develop a rash.  You have a fever. Get help right away if:  You have trouble breathing. This information is not intended to replace advice given to you by your health care provider. Make sure you discuss any questions you have with your health care provider. Document Released: 03/30/2013 Document Revised: 11/12/2015 Document Reviewed: 09/29/2015 Elsevier Interactive Patient Education  2019 [employer]

## 2018-12-17 NOTE — Brief Op Note (Signed)
  NEUROSURGERY BRIEF OPERATIVE  NOTE   PREOP DX: Hx of Right ICA aneurysm  POSTOP DX: Same  PROCEDURE: Diagnostic cerebral angiogram  SURGEON: Dr. Consuella Lose, MD  ANESTHESIA: IV Sedation with Local  EBL: Minimal  SPECIMENS: None  COMPLICATIONS: None  CONDITION: Stable to recovery  FINDINGS (Full report in [facility]): 1. Complete occlusion of previously seen RICA aneurysm. No in-stent stenosis.

## 2018-12-22 ENCOUNTER — Encounter (HOSPITAL_COMMUNITY): Payer: Self-pay

## 2018-12-22 ENCOUNTER — Other Ambulatory Visit (HOSPITAL_COMMUNITY): Payer: Self-pay | Admitting: Neurosurgery

## 2018-12-22 DIAGNOSIS — I671 Cerebral aneurysm, nonruptured: Secondary | ICD-10-CM
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
